# Patient Record
Sex: Male | Born: 1975
Health system: Southern US, Community
[De-identification: ages and names within clinical notes are randomized; demographics above are authoritative.]

## PROBLEM LIST (undated history)

## (undated) DIAGNOSIS — L719 Rosacea, unspecified: Secondary | ICD-10-CM

## (undated) DIAGNOSIS — F411 Generalized anxiety disorder: Secondary | ICD-10-CM

## (undated) DIAGNOSIS — I10 Essential (primary) hypertension: Secondary | ICD-10-CM

## (undated) DIAGNOSIS — F410 Panic disorder [episodic paroxysmal anxiety] without agoraphobia: Secondary | ICD-10-CM

## (undated) HISTORY — DX: Generalized anxiety disorder: F41.1

## (undated) HISTORY — DX: Panic disorder (episodic paroxysmal anxiety): F41.0

## (undated) HISTORY — PX: KNEE SURGERY: SHX244

## (undated) HISTORY — PX: APPENDECTOMY: SHX54

## (undated) HISTORY — DX: Essential (primary) hypertension: I10

## (undated) HISTORY — DX: Rosacea, unspecified: L71.9

---

## 2012-06-14 ENCOUNTER — Emergency Department
Admission: EM | Admit: 2012-06-14 | Discharge: 2012-06-14 | Disposition: A | Discharge status: Home / Self Care | Payer: No Typology Code available for payment source | Source: Home / Self Care | Attending: Family Medicine | Admitting: Family Medicine

## 2012-06-14 ENCOUNTER — Encounter: Payer: Self-pay | Admitting: *Deleted

## 2012-06-14 DIAGNOSIS — R509 Fever, unspecified: Secondary | ICD-10-CM

## 2012-06-14 DIAGNOSIS — R030 Elevated blood-pressure reading, without diagnosis of hypertension: Secondary | ICD-10-CM

## 2012-06-14 DIAGNOSIS — B349 Viral infection, unspecified: Secondary | ICD-10-CM

## 2012-06-14 DIAGNOSIS — J029 Acute pharyngitis, unspecified: Secondary | ICD-10-CM

## 2012-06-14 DIAGNOSIS — R6889 Other general symptoms and signs: Secondary | ICD-10-CM

## 2012-06-14 LAB — POCT INFLUENZA A/B: Influenza A, POC: NEGATIVE

## 2012-06-14 MED ORDER — OSELTAMIVIR PHOSPHATE 75 MG PO CAPS: 75.0000 mg | ORAL_CAPSULE | Freq: Two times a day (BID) | ORAL | Status: DC

## 2012-06-14 NOTE — ED Provider Notes (Signed)
History     CSN: [id number]  Arrival date & time 06/14/12  1939   First MD Initiated Contact with Patient 06/14/12 1947      Chief Complaint  Patient presents with  . Fever  . Generalized Body Aches  . Sore Throat   HPI  URI Symptoms Onset: 1 day  Description: generalized malaise, fever, chills, body aches, rhinorrhea  Modifying factors:  Has not had flu shot   Symptoms Nasal discharge: yes Fever: yes Sore throat: yes Cough: mild Wheezing: no Ear pain: no GI symptoms: no Sick contacts: yes  Red Flags  Stiff neck: no Dyspnea: no Rash: no Swallowing difficulty: no  Sinusitis Risk Factors Headache/face pain: no Double sickening: no tooth pain: no  Allergy Risk Factors Sneezing: no Itchy scratchy throat: no Seasonal symptoms: no  Flu Risk Factors Headache: no muscle aches: no severe fatigue: no   History reviewed. No pertinent past medical history.  Past Surgical History  Procedure Date  . Appendectomy     History reviewed. No pertinent family history.  History  Substance Use Topics  . Smoking status: Former Games developer  . Smokeless tobacco: Not on file  . Alcohol Use: No      Review of Systems  All other systems reviewed and are negative.    Allergies  Review of patient's allergies indicates no known allergies.  Home Medications   Current Outpatient Rx  Name  Route  Sig  Dispense  Refill  . OSELTAMIVIR PHOSPHATE 75 MG PO CAPS   Oral   Take 1 capsule (75 mg total) by mouth 2 (two) times daily.   10 capsule   0     BP 169/117  Pulse 103  Temp 98.8 F (37.1 C) (Oral)  Resp 16  Ht 5\' 8"  (1.727 m)  Wt 169 lb 4 oz (76.771 kg)  BMI 25.73 kg/m2  SpO2 98%  Physical Exam  Constitutional: He appears well-developed and well-nourished.  HENT:  Head: Normocephalic and atraumatic.  Right Ear: External ear normal.  Left Ear: External ear normal.       +nasal erythema, rhinorrhea bilaterally, + post oropharyngeal erythema    Eyes:  Conjunctivae normal are normal. Pupils are equal, round, and reactive to light.  Neck: Normal range of motion. Neck supple.  Cardiovascular: Normal rate, regular rhythm and normal heart sounds.   Pulmonary/Chest: Effort normal and breath sounds normal.  Abdominal: Soft.  Musculoskeletal: Normal range of motion.  Neurological: He is alert.  Skin: Skin is warm.    ED Course  Procedures (including critical care time)   Labs Reviewed  POCT INFLUENZA A/B  POCT RAPID STREP A (OFFICE)   No results found.   1. Flu-like symptoms   2. Viral illness   3. Elevated blood pressure reading without diagnosis of hypertension       MDM  Rapid flu negative though sxs consistent with flu. Will place on tamiflu for coverage.  Discussed infectious and resp red flags at length including SOB and weakness.  Noted elevated BP in setting of fever, high dose NSAID and nasal decongestant use. No CP. Follow up BP 171/93. Discussed avoidance of these medications. CV red flags discussed.  Otherwise follow up as needed.     The patient and/or caregiver has been counseled thoroughly with regard to treatment plan and/or medications prescribed including dosage, schedule, interactions, rationale for use, and possible side effects and they verbalize understanding. Diagnoses and expected course of recovery discussed and will return if not improved as  expected or if the condition worsens. Patient and/or caregiver verbalized understanding.             Doree Albee, MD 06/14/12 2027

## 2012-06-14 NOTE — ED Notes (Signed)
Pt c/o fever, sore throat, body ache since this morning. Has tried OTC Ibuprofen and Sudafed. Did not get flu shot this year.

## 2012-12-20 ENCOUNTER — Emergency Department
Admission: EM | Admit: 2012-12-20 | Discharge: 2012-12-20 | Disposition: A | Discharge status: Home / Self Care | Payer: No Typology Code available for payment source | Source: Home / Self Care | Attending: Emergency Medicine | Admitting: Emergency Medicine

## 2012-12-20 ENCOUNTER — Encounter: Payer: Self-pay | Admitting: Emergency Medicine

## 2012-12-20 ENCOUNTER — Emergency Department (INDEPENDENT_AMBULATORY_CARE_PROVIDER_SITE_OTHER): Payer: No Typology Code available for payment source

## 2012-12-20 DIAGNOSIS — M7989 Other specified soft tissue disorders: Secondary | ICD-10-CM

## 2012-12-20 DIAGNOSIS — M79609 Pain in unspecified limb: Secondary | ICD-10-CM

## 2012-12-20 DIAGNOSIS — M79644 Pain in right finger(s): Secondary | ICD-10-CM

## 2012-12-20 DIAGNOSIS — S6990XA Unspecified injury of unspecified wrist, hand and finger(s), initial encounter: Secondary | ICD-10-CM

## 2012-12-20 NOTE — ED Provider Notes (Signed)
CSN: [id number]     Arrival date & time 12/20/12  0846 History     First MD Initiated Contact with Patient 12/20/12 [phone]     Chief Complaint  Patient presents with  . 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  He slammed a car door onto his right middle finger last night.  He has used some ice and ibuprofen with little bit.  He does have a remote history of an injury in this finger while playing football in high school however up until now has been fully functioning.  Pain is constant moderate and a throbbing soreness.   History reviewed. No pertinent past medical history. Past Surgical History  Procedure Laterality Date  . Appendectomy     Family History  Problem Relation Age of Onset  . Hypertension Father    History  Substance Use Topics  . Smoking status: Current Every Day Smoker  . Smokeless tobacco: Not on file  . Alcohol Use: No    Review of Systems  All other systems reviewed and are negative.    Allergies  Rocephin  Home Medications   Current Outpatient Rx  Name  Route  Sig  Dispense  Refill  . oseltamivir (TAMIFLU) 75 MG capsule   Oral   Take 1 capsule (75 mg total) by mouth 2 (two) times daily.   10 capsule   0    BP 159/84  Pulse 80  Temp(Src) 98.2 F (36.8 C) (Oral)  Resp 16  Ht 5\' 8"  (1.727 m)  Wt 165 lb (74.844 kg)  BMI 25.09 kg/m2  SpO2 100% Physical Exam  Nursing note and vitals reviewed. Constitutional: He is oriented to person, place, and time. He appears well-developed and well-nourished.  HENT:  Head: Normocephalic and atraumatic.  Eyes: No scleral icterus.  Neck: Neck supple.  Cardiovascular: Regular rhythm and normal heart sounds.   Pulmonary/Chest: Effort normal and breath sounds normal. No respiratory distress.  Musculoskeletal:   Right middle finger examination demonstrates normal functioning with flexion-extension of the DIP, PIP, and MCP.  Distal neurovascular status is intact.  He has a small abrasion over the PIP and tenderness to palpation throughout the joint and a little bit proximal to that as well.  There is localized swelling. No other hand or wrist tenderness.  Neurological: He is alert and oriented to person, place, and time.  Skin: Skin is warm and dry.  Psychiatric: He has a normal mood and affect. His speech is normal.    ED Course   Procedures (including critical care time)  Labs Reviewed - No data to display Dg Finger Middle Right  12/20/2012   *RADIOLOGY REPORT*  Clinical Data: Slammed middle finger in car door 1 day ago with laceration over proximal interphalangeal joint  RIGHT MIDDLE FINGER 2+V  Comparison: None.  Findings: No definite fracture dislocation.  Along the volar surface of the base of the middle phalanx there is a tiny fleck of bone which could represent a chip fracture or a tiny sesamoid.  No donor site is appreciated to suggest that it represents a fracture. There is however soft tissue swelling at the proximal interphalangeal joint.  There is no radiodense foreign body.  IMPRESSION: Soft tissue swelling proximal interphalangeal joint with no definite fracture.  Tiny fleck of bone seen could represent an extremely subtle chip fracture or sesamoid.   Original  Report Authenticated By: Esperanza Heir, M.D.   1. Finger pain, right     MDM   And x-ray was obtained and read by the radiologist as above.  Possible small avulsion on the volar side, but most likely a finger sprain/contusion with some mild osteoarthritic changes.  Encourage rest, ice, compression with ACE bandage and/or a brace, and elevation of injured body part.  The role of anti-inflammatories is discussed with the patient.  We placed him in a finger splint and gave him a note for work.  Followup in sports medicine if not  improving in the next 2 weeks.  Marlaine Hind, MD 12/20/12 [phone]

## 2012-12-20 NOTE — ED Notes (Signed)
Reports getting right #3/middle finger slammed in car door last evening. Took ibuprofen this a.m. and has used ice.

## 2013-04-27 ENCOUNTER — Ambulatory Visit: Payer: No Typology Code available for payment source | Admitting: Physical Therapy

## 2013-04-29 ENCOUNTER — Ambulatory Visit: Payer: Self-pay | Admitting: Physical Therapy

## 2013-08-08 ENCOUNTER — Ambulatory Visit (INDEPENDENT_AMBULATORY_CARE_PROVIDER_SITE_OTHER): Payer: BC Managed Care – PPO | Admitting: Sports Medicine

## 2013-08-08 ENCOUNTER — Ambulatory Visit (INDEPENDENT_AMBULATORY_CARE_PROVIDER_SITE_OTHER): Payer: BC Managed Care – PPO

## 2013-08-08 ENCOUNTER — Encounter: Payer: Self-pay | Admitting: Sports Medicine

## 2013-08-08 ENCOUNTER — Telehealth: Payer: Self-pay | Admitting: *Deleted

## 2013-08-08 VITALS — BP 151/76 | HR 88 | Ht 68.0 in | Wt 165.0 lb

## 2013-08-08 DIAGNOSIS — M25569 Pain in unspecified knee: Secondary | ICD-10-CM

## 2013-08-08 DIAGNOSIS — M25562 Pain in left knee: Secondary | ICD-10-CM | POA: Insufficient documentation

## 2013-08-08 NOTE — Telephone Encounter (Signed)
PA obtained for MRI Knee LT w/o contrast. Auth # [id number]. Exp. 09/06/13.  Meyer CoryMisty Kree Armato, LPN

## 2013-08-08 NOTE — Assessment & Plan Note (Addendum)
Ernest Cole does endorse a history of what sounds like patellar dislocation, he does tell me it was reduced but tells me that it was his meniscus. Symptoms today are predominantly patellofemoral with a positive apprehension sign, and tenderness under medial and lateral patellar facets. Treatment will center around aggressive physical therapy, intra-articular injection today, x-rays. As his symptoms have been present so long, and he does prefer that we also ordered his MRI, I think this is acceptable. Patellar stabilizing brace. Return to see me in one month.

## 2013-08-08 NOTE — Progress Notes (Signed)
   Subjective:    I'm seeing this patient as a consultation for:  [facility] Dr[facility] Glee ArvinMichael Xu  CC:  L knee pain  HPI: Patient is a pleasant 38 yo male who presents with L knee pain for one year. He describes two events last year where his knee locked up and had to be popped back into place. He has most pain in the medial joint line with walking up stairs or sitting for extended periods of time.  In the last week he has started to have tingling in the posterior knee. He has seen a physician and was told to start PT for a meniscal tear, but was unable to due to insurance. He has also had a steroid knee injection, which relieved the pain for one month.   Past medical history, Surgical history, Family history not pertinant except as noted below, Social history, Allergies, and medications have been entered into the medical record, reviewed, and no changes needed.   Review of Systems: No headache, visual changes, nausea, vomiting, diarrhea, constipation, dizziness, abdominal pain, skin rash, fevers, chills, night sweats, weight loss, swollen lymph nodes, body aches, joint swelling, muscle aches, chest pain, shortness of breath, mood changes, visual or auditory hallucinations.   Objective:   General: Well Developed, well nourished, and in no acute distress.  Neuro/Psych: Alert and oriented x3, extra-ocular muscles intact, able to move all 4 extremities, sensation grossly intact. Skin: Warm and dry, no rashes noted.  Respiratory: Not using accessory muscles, speaking in full sentences, trachea midline.  Cardiovascular: Pulses palpable, no extremity edema. Abdomen: Does not appear distended. L Knee: Swollen along medial aspect. Tenderness to palpation along menicus and along the medial and lateral patellar facets. Pain with knee extension and valgus stress.   Procedure: Real-time Ultrasound Guided Injection of left knee Device: GE Logiq E  Verbal informed consent obtained.  Time-out conducted.    Noted no overlying erythema, induration, or other signs of local infection.  Skin prepped in a sterile fashion.  Local anesthesia: Topical Ethyl chloride.  With sterile technique and under real time ultrasound guidance:  2 cc Kenalog 40, 4 cc lidocaine injected easily into the suprapatellar recess. Completed without difficulty  Pain immediately resolved suggesting accurate placement of the medication.  Advised to call if fevers/chills, erythema, induration, drainage, or persistent bleeding.  Images permanently stored and available for review in the ultrasound unit.  Impression: Technically successful ultrasound guided injection.  X-rays were reviewed and do show mild patellar and femoral trochlear spurring, otherwise unremarkable.  Impression and Recommendations:   This case required medical decision making of moderate complexity.

## 2013-08-10 ENCOUNTER — Telehealth: Payer: Self-pay

## 2013-08-10 NOTE — Telephone Encounter (Signed)
[facility] called and stated that patient cancelled his appt because he could not afford it. Estelle JuneRhonda Cunningham,CMA'

## 2013-08-11 NOTE — Telephone Encounter (Signed)
Noted  

## 2013-08-13 ENCOUNTER — Ambulatory Visit (HOSPITAL_BASED_OUTPATIENT_CLINIC_OR_DEPARTMENT_OTHER): Payer: BC Managed Care – PPO

## 2013-08-15 ENCOUNTER — Ambulatory Visit (INDEPENDENT_AMBULATORY_CARE_PROVIDER_SITE_OTHER): Payer: BC Managed Care – PPO

## 2013-08-15 DIAGNOSIS — R609 Edema, unspecified: Secondary | ICD-10-CM

## 2013-08-15 DIAGNOSIS — M6281 Muscle weakness (generalized): Secondary | ICD-10-CM

## 2013-08-15 DIAGNOSIS — M25569 Pain in unspecified knee: Secondary | ICD-10-CM

## 2013-08-18 ENCOUNTER — Encounter: Payer: BC Managed Care – PPO | Admitting: Physical Therapy

## 2013-08-22 ENCOUNTER — Encounter (INDEPENDENT_AMBULATORY_CARE_PROVIDER_SITE_OTHER): Payer: BC Managed Care – PPO | Admitting: Physical Therapy

## 2013-08-22 DIAGNOSIS — M25569 Pain in unspecified knee: Secondary | ICD-10-CM

## 2013-08-22 DIAGNOSIS — M6281 Muscle weakness (generalized): Secondary | ICD-10-CM

## 2013-08-22 DIAGNOSIS — R609 Edema, unspecified: Secondary | ICD-10-CM

## 2013-08-25 ENCOUNTER — Encounter (INDEPENDENT_AMBULATORY_CARE_PROVIDER_SITE_OTHER): Payer: BC Managed Care – PPO | Admitting: Physical Therapy

## 2013-08-25 DIAGNOSIS — M6281 Muscle weakness (generalized): Secondary | ICD-10-CM

## 2013-08-25 DIAGNOSIS — R609 Edema, unspecified: Secondary | ICD-10-CM

## 2013-08-25 DIAGNOSIS — M25569 Pain in unspecified knee: Secondary | ICD-10-CM

## 2013-08-29 ENCOUNTER — Encounter: Payer: BC Managed Care – PPO | Admitting: Physical Therapy

## 2013-09-01 ENCOUNTER — Encounter (INDEPENDENT_AMBULATORY_CARE_PROVIDER_SITE_OTHER): Payer: BC Managed Care – PPO

## 2013-09-01 DIAGNOSIS — M6281 Muscle weakness (generalized): Secondary | ICD-10-CM

## 2013-09-01 DIAGNOSIS — R609 Edema, unspecified: Secondary | ICD-10-CM

## 2013-09-01 DIAGNOSIS — M25569 Pain in unspecified knee: Secondary | ICD-10-CM

## 2014-10-25 ENCOUNTER — Emergency Department (INDEPENDENT_AMBULATORY_CARE_PROVIDER_SITE_OTHER)
Admission: EM | Admit: 2014-10-25 | Discharge: 2014-10-25 | Disposition: A | Discharge status: Home / Self Care | Payer: BLUE CROSS/BLUE SHIELD | Source: Home / Self Care | Attending: Emergency Medicine | Admitting: Emergency Medicine

## 2014-10-25 ENCOUNTER — Encounter: Payer: Self-pay | Admitting: *Deleted

## 2014-10-25 DIAGNOSIS — S39011A Strain of muscle, fascia and tendon of abdomen, initial encounter: Secondary | ICD-10-CM

## 2014-10-25 NOTE — ED Notes (Signed)
Pt reports helping friend move 5 days ago, left pain in LLQ of abdomen. Mild pain present ever since.

## 2014-10-25 NOTE — ED Provider Notes (Signed)
CSN: [id number]     Arrival date & time 10/25/14  [id number] History   First MD Initiated Contact with Patient 10/25/14 1001     Chief Complaint  Patient presents with  . Abdominal Pain   (Consider location/radiation/quality/duration/timing/severity/associated sxs/prior Treatment) HPI Patient reports that 5 days ago, he was helping a friend move and he did repetitive lifting of items and boxes and did repetitive lifting and twisting. Ever since then, has a mild left mid abdominal wall pain, worse with movement. No deformity of abdominal wall seen, no pooching out of abdominal wall that he's noticed, but he wanted to be checked for abdominal wall hernia. No nausea or vomiting or fever or chills. No GU symptoms. No testicular or groin pain. History reviewed. No pertinent past medical history. Past Surgical History  Procedure Laterality Date  . Appendectomy     Family History  Problem Relation Age of Onset  . Hypertension Father    History  Substance Use Topics  . Smoking status: Former Games developermoker  . Smokeless tobacco: Never Used  . Alcohol Use: No    Review of Systems Remainder of Review of Systems negative for acute change except as noted in the HPI.  Allergies  Rocephin  Home Medications   Prior to Admission medications   Not on File   BP 164/104 mmHg  Pulse 98  Temp(Src) 98.3 F (36.8 C) (Oral)  Resp 16  Wt 165 lb (74.844 kg)  SpO2 99% Physical Exam  Constitutional: He is oriented to person, place, and time. He appears well-developed and well-nourished. No distress.  HENT:  Head: Normocephalic and atraumatic.  Eyes: Conjunctivae and EOM are normal. Pupils are equal, round, and reactive to light. No scleral icterus.  Neck: Normal range of motion.  Cardiovascular: Normal rate.   Pulmonary/Chest: Effort normal.  Abdominal: Soft. Bowel sounds are normal. He exhibits no distension and no mass. There is no hepatosplenomegaly. There is tenderness (mild, left midabdominal wall  but no deep tenderness.). There is no rebound, no guarding and no CVA tenderness. Hernia confirmed negative in the ventral area.  Musculoskeletal: Normal range of motion.  Neurological: He is alert and oriented to person, place, and time.  Skin: Skin is warm.  Psychiatric: He has a normal mood and affect.  Nursing note and vitals reviewed.   ED Course  Procedures (including critical care time) Labs Review Labs Reviewed - No data to display  Imaging Review No results found.   MDM   1. Abdominal wall strain, initial encounter    Discussed findings with patient. Questions invited and answered. Advised heat, ibuprofen when necessary. I would expect symptoms to resolve within the next several days. Reassured, he'll follow up with PCP when necessary Follow-up with your primary care doctor in 5-7 days if not improving, or sooner if symptoms become worse. Precautions discussed. Red flags discussed. Questions invited and answered. Patient voiced understanding and agreement.     Lajean Manesavid Massey, MD 10/25/14 [phone]

## 2015-03-16 ENCOUNTER — Ambulatory Visit (INDEPENDENT_AMBULATORY_CARE_PROVIDER_SITE_OTHER): Payer: BLUE CROSS/BLUE SHIELD | Admitting: Family Medicine

## 2015-03-16 ENCOUNTER — Ambulatory Visit (INDEPENDENT_AMBULATORY_CARE_PROVIDER_SITE_OTHER): Payer: BLUE CROSS/BLUE SHIELD

## 2015-03-16 ENCOUNTER — Encounter: Payer: Self-pay | Admitting: Family Medicine

## 2015-03-16 VITALS — BP 170/107 | HR 71 | Wt 167.0 lb

## 2015-03-16 DIAGNOSIS — M2391 Unspecified internal derangement of right knee: Secondary | ICD-10-CM | POA: Diagnosis not present

## 2015-03-16 MED ORDER — TRAMADOL HCL 50 MG PO TABS: 50.0000 mg | ORAL_TABLET | Freq: Three times a day (TID) | ORAL | Status: DC | PRN

## 2015-03-16 NOTE — Assessment & Plan Note (Signed)
Concern for a locked meniscus or bucket handle tear. Patient is unable to walk. Plan for stat MRI hopefully tomorrow. Crutches and pain medicines prescribed. Return following MRI for further discussion.

## 2015-03-16 NOTE — Addendum Note (Signed)
Addended by: Rodolph BongOREY, Halima Fogal S on: 03/16/2015 02:48 PM   Modules accepted: Kipp BroodSmartSet

## 2015-03-16 NOTE — Patient Instructions (Addendum)
Thank you for coming in today. We will call about MRI.  Take tramadol for pain.  Return after MRI  Meniscus Tear A meniscus tear is a knee injury in which a piece of the meniscus is torn. The meniscus is a thick, rubbery, wedge-shaped cartilage in the knee. Two menisci are located in each knee. They sit between the upper bone (femur) and lower bone (tibia) that make up the knee joint. Each meniscus acts as a shock absorber for the knee. A torn meniscus is one of the most common types of knee injuries. This injury can range from mild to severe. Surgery may be needed for a severe tear. CAUSES This injury may be caused by any squatting, twisting, or pivoting movement. Sports-related injuries are the most common cause. These often occur from:  Running and stopping suddenly.  Changing direction.  Being tackled or knocked off your feet. As people get older, their meniscus gets thinner and weaker. In these people, tears can happen more easily, such as from climbing stairs.  RISK FACTORS This injury is more likely to happen to:  People who play contact sports.  Males.  People who are 5130-40 years of age. SYMPTOMS  Symptoms of this injury include:  Knee pain, especially at the side of the knee joint. You may feel pain when the injury occurs, or you may only hear a pop and feel pain later.  A feeling that your knee is clicking, catching, locking, or giving way.  Not being able to fully bend or extend your knee.  Bruising or swelling in your knee. DIAGNOSIS  This injury may be diagnosed based on your symptoms and a physical exam. The physical exam may include:  Moving your knee in different ways.  Feeling for tenderness.  Listening for a clicking sound.  Checking if your knee locks or catches. You may also have tests, such as:  X-rays.  MRI.  A procedure to look inside your knee with a narrow surgical telescope (arthroscopy). You may be referred to a knee specialist  (orthopedic surgeon). TREATMENT  Treatment for this injury depends on the severity of the tear. Treatment for a mild tear may include:  Rest.  Medicine to reduce pain and swelling. This is usually a nonsteroidal anti-inflammatory drug (NSAID).  A knee brace or an elastic sleeve or wrap.  Using crutches or a walker to keep weight off your knee and to help you walk.  Exercises to strengthen your knee (physical therapy). You may need surgery if you have a severe tear or if other treatments are not working.  HOME CARE INSTRUCTIONS Managing Pain and Swelling  Take over-the-counter and prescription medicines only as told by your health care provider.  If directed, apply ice to the injured area:  Put ice in a plastic bag.  Place a towel between your skin and the bag.  Leave the ice on for 20 minutes, 2-3 times per day.  Raise (elevate) the injured area above the level of your heart while you are sitting or lying down. Activity  Do not use the injured limb to support your body weight until your health care provider says that you can. Use crutches or a walker as told by your health care provider.  Return to your normal activities as told by your health care provider. Ask your health care provider what activities are safe for you.  Perform range-of-motion exercises only as told by your health care provider.  Begin doing exercises to strengthen your knee and leg  muscles only as told by your health care provider. After you recover, your health care provider may recommend these exercises to help prevent another injury. General Instructions  Use a knee brace or elastic wrap as told by your health care provider.  Keep all follow-up visits as told by your health care provider. This is important. SEEK MEDICAL CARE IF:  You have a fever.  Your knee becomes red, tender, or swollen.  Your pain medicine is not helping.  Your symptoms get worse or do not improve after 2 weeks of home  care.   This information is not intended to replace advice given to you by your health care provider. Make sure you discuss any questions you have with your health care provider.   Document Released: 08/02/2002 Document Revised: 01/31/2015 Document Reviewed: 09/04/2014 Elsevier Interactive Patient Education [employer].

## 2015-03-16 NOTE — Progress Notes (Signed)
Addendum: MRI report back from [facility] shows a large medial meniscus bucket handle tear flipped into the intercondylar notch. I have discussed the case with Dr. Thurston HoleWainer. [facility] at 2pm.

## 2015-03-16 NOTE — Progress Notes (Signed)
Quick Note:  Xray was normal. Awaiting MRI ______

## 2015-03-16 NOTE — Progress Notes (Signed)
   Subjective:    I'm seeing this patient as a consultation for:  Junius FinnerErin O'Malley PA-C  CC: Knee pain.  HPI: Patient was referred directly from triage of urgent care this morning for acute right knee pain. Patient was in his normal state of health when he squatted down and felt a pop. He notes significant knee pain and feels that he cannot straighten his leg fully. He feels as though it is becoming locked. He is unable to walk. He thinks something is dislocated or out of place. He notes that he's had similar problems with his left knee that is suspected to be due to a meniscus injury. He has never had these problems with his right knee. He is unable to walk or work because of this pain and the pain is severe. He has not tried any treatment yet.  Past medical history, Surgical history, Family history not pertinant except as noted below, Social history, Allergies, and medications have been entered into the medical record, reviewed, and no changes needed.   Review of Systems: No headache, visual changes, nausea, vomiting, diarrhea, constipation, dizziness, abdominal pain, skin rash, fevers, chills, night sweats, weight loss, swollen lymph nodes, body aches, joint swelling, muscle aches, chest pain, shortness of breath, mood changes, visual or auditory hallucinations.   Objective:    Filed Vitals:   03/16/15 1051  BP: 170/107  Pulse: 71   General: Well Developed, well nourished, and in no acute distress.  Neuro/Psych: Alert and oriented x3, extra-ocular muscles intact, able to move all 4 extremities, sensation grossly intact. Skin: Warm and dry, no rashes noted.  Respiratory: Not using accessory muscles, speaking in full sentences, trachea midline.  Cardiovascular: Pulses palpable, no extremity edema. Abdomen: Does not appear distended. MSK: Right knee well-appearing without significant effusion or deformity. Patella is in place with normal talar motion without patellar apprehension test. Very  tender to palpation medial joint line. Positive medial McMurray's test. Unable to fully extend or flex the knee. Stable valgus and varus stress and negative anterior drawer test.  No results found for this or any previous visit (from the past 24 hour(s)). Dg Knee Complete 4 Views Right  03/16/2015  CLINICAL DATA:  Locking right knee EXAM: RIGHT KNEE - COMPLETE 4+ VIEW COMPARISON:  08/08/2013 FINDINGS: There is no evidence of fracture, dislocation, or joint effusion. There is no evidence of arthropathy or other focal bone abnormality. Soft tissues are unremarkable. IMPRESSION: Negative. Electronically Signed   By: Marlan Palauharles  Clark M.D.   On: 03/16/2015 12:44    Impression and Recommendations:   This case required medical decision making of moderate complexity.

## 2015-03-27 ENCOUNTER — Ambulatory Visit (HOSPITAL_COMMUNITY)
Admission: RE | Admit: 2015-03-27 | Discharge: 2015-03-27 | Disposition: A | Discharge status: Home / Self Care | Payer: BLUE CROSS/BLUE SHIELD | Source: Ambulatory Visit | Attending: Internal Medicine | Admitting: Internal Medicine

## 2015-03-27 ENCOUNTER — Other Ambulatory Visit (HOSPITAL_COMMUNITY): Payer: Self-pay | Admitting: Orthopedic Surgery

## 2015-03-27 DIAGNOSIS — M7989 Other specified soft tissue disorders: Secondary | ICD-10-CM | POA: Diagnosis not present

## 2015-04-04 ENCOUNTER — Ambulatory Visit: Payer: BLUE CROSS/BLUE SHIELD | Admitting: Rehabilitative and Restorative Service Providers"

## 2015-11-14 DIAGNOSIS — R220 Localized swelling, mass and lump, head: Secondary | ICD-10-CM | POA: Diagnosis not present

## 2015-11-14 DIAGNOSIS — K029 Dental caries, unspecified: Secondary | ICD-10-CM | POA: Diagnosis not present

## 2015-11-14 DIAGNOSIS — K047 Periapical abscess without sinus: Secondary | ICD-10-CM | POA: Diagnosis not present

## 2016-02-03 ENCOUNTER — Emergency Department
Admission: EM | Admit: 2016-02-03 | Discharge: 2016-02-03 | Disposition: A | Discharge status: Home / Self Care | Payer: BLUE CROSS/BLUE SHIELD | Source: Home / Self Care | Attending: Family Medicine | Admitting: Family Medicine

## 2016-02-03 ENCOUNTER — Encounter: Payer: Self-pay | Admitting: Emergency Medicine

## 2016-02-03 DIAGNOSIS — M25532 Pain in left wrist: Secondary | ICD-10-CM | POA: Diagnosis not present

## 2016-02-03 DIAGNOSIS — S61412A Laceration without foreign body of left hand, initial encounter: Secondary | ICD-10-CM | POA: Diagnosis not present

## 2016-02-03 DIAGNOSIS — R030 Elevated blood-pressure reading, without diagnosis of hypertension: Secondary | ICD-10-CM

## 2016-02-03 DIAGNOSIS — IMO0001 Reserved for inherently not codable concepts without codable children: Secondary | ICD-10-CM

## 2016-02-03 DIAGNOSIS — S0990XA Unspecified injury of head, initial encounter: Secondary | ICD-10-CM

## 2016-02-03 MED ORDER — HYDROCODONE-ACETAMINOPHEN 5-325 MG PO TABS: 1.0000 | ORAL_TABLET | Freq: Four times a day (QID) | ORAL | 0 refills | Status: DC | PRN

## 2016-02-03 MED ORDER — CEPHALEXIN 500 MG PO CAPS: 500.0000 mg | ORAL_CAPSULE | Freq: Two times a day (BID) | ORAL | 0 refills | Status: DC

## 2016-02-03 MED ORDER — LIDOCAINE-EPINEPHRINE-TETRACAINE (LET) SOLUTION: 3.0000 mL | Freq: Once | NASAL | Status: DC

## 2016-02-03 NOTE — ED Provider Notes (Signed)
CSN: [id number]     Arrival date & time 02/03/16  1333 History   First MD Initiated Contact with Patient 02/03/16 1335     Chief Complaint  Patient presents with  . Laceration   (Consider location/radiation/quality/duration/timing/severity/associated sxs/prior Treatment) HPI  Ernest Cole is a 40 y.o. male presenting to UC with c/o laceration to his Left palm close to his wrist just PTA after hit by a garage door.  He states he works on [employer] and was trying to put a garage door back on track that had not been opened in years, but garage was too heavy for him to hold up.  He also reports getting hit in the back of the head by garage door but denies LOC.  Bleeding controlled PTA with direct pressure.  Pain is aching and sharp, moderate in severity.  He does not believe he was hit by garage hard enough to break any bones.  He did take ibuprofen PTA.  He is not on blood thinners. He is Right hand dominant.  Pt states he is more in shock of what happened than in pain.  Last tetanus was 1 year ago.   History reviewed. No pertinent past medical history. Past Surgical History:  Procedure Laterality Date  . APPENDECTOMY     Family History  Problem Relation Age of Onset  . Hypertension Father    Social History  Substance Use Topics  . Smoking status: Former Games developer  . Smokeless tobacco: Never Used  . Alcohol use No    Review of Systems  Musculoskeletal: Negative for arthralgias, joint swelling and myalgias.  Skin: Positive for wound. Negative for color change.    Allergies  Rocephin [ceftriaxone sodium in dextrose]  Home Medications   Prior to Admission medications   Medication Sig Start Date End Date Taking? Authorizing Provider  cephALEXin (KEFLEX) 500 MG capsule Take 1 capsule (500 mg total) by mouth 2 (two) times daily. 02/03/16   Junius Finner, PA-C  HYDROcodone-acetaminophen (NORCO/VICODIN) 5-325 MG tablet Take 1-2 tablets by mouth every 6 (six) hours as needed for moderate  pain or severe pain. 02/03/16   Junius Finner, PA-C   Meds Ordered and Administered this Visit   Medications  lidocaine-EPINEPHrine-tetracaine (LET) solution (not administered)    BP (!) 193/129 (BP Location: Left Arm)   Pulse 76   Temp 98.2 F (36.8 C) (Oral)   Ht 5\' 8"  (1.727 m)   Wt 165 lb (74.8 kg)   SpO2 99%   BMI 25.09 kg/m  No data found.   Physical Exam  Constitutional: He is oriented to person, place, and time. He appears well-developed and well-nourished.  HENT:  Head: Normocephalic. Head is with contusion.    Eyes: EOM are normal.  Neck: Normal range of motion.  Cardiovascular: Normal rate.   Pulmonary/Chest: Effort normal.  Musculoskeletal: Normal range of motion. He exhibits tenderness. He exhibits no edema or deformity.       Hands: Left hand and wrist: Full ROM, tenderness to palmar aspect towards wrist due to wound (see skin exam)  Neurological: He is alert and oriented to person, place, and time.  Skin: Skin is warm and dry. Capillary refill takes less than 2 seconds.  Left hand, palmar aspect, near wrist: 5cm laceration with jagged edges, adipose tissue exposed. Bleeding controlled. No tendons or muscle visible.  No foreign body seen or palpated.  Psychiatric: He has a normal mood and affect. His behavior is normal.  Nursing note and vitals reviewed.  Urgent Care Course   Clinical Course    .Marland KitchenLaceration Repair Date/Time: 02/03/2016 3:05 PM Performed by: Junius Finner Authorized by: Donna Christen A   Consent:    Consent obtained:  Verbal   Consent given by:  Patient   Risks discussed:  Infection, pain, nerve damage, poor cosmetic result, poor wound healing, need for additional repair, retained foreign body, tendon damage and vascular damage Anesthesia (see MAR for exact dosages):    Anesthesia method:  Topical application and local infiltration   Topical anesthetic:  LET   Local anesthetic:  Lidocaine 1% w/o epi Laceration details:     Location:  Hand   Hand location:  L palm   Length (cm):  5   Depth (mm):  2 Repair type:    Repair type:  Intermediate Pre-procedure details:    Preparation:  Patient was prepped and draped in usual sterile fashion Exploration:    Hemostasis achieved with:  Direct pressure   Wound exploration: wound explored through full range of motion and entire depth of wound probed and visualized     Wound extent: no fascia violation noted, no foreign bodies/material noted, no muscle damage noted, no nerve damage noted, no tendon damage noted and no vascular damage noted     Contaminated: no   Treatment:    Area cleansed with:  Saline   Amount of cleaning:  Extensive   Irrigation solution:  Sterile saline   Irrigation volume:  40cc   Irrigation method:  Syringe   Visualized foreign bodies/material removed: no   Skin repair:    Repair method:  Sutures   Suture size:  4-0   Suture material:  Prolene   Suture technique:  Simple interrupted   Number of sutures:  9 Approximation:    Approximation:  Close   Vermilion border: well-aligned   Post-procedure details:    Dressing:  Antibiotic ointment and non-adherent dressing (wrist splint applied to prevent pulling of sutures with wrist flexion and extension)   Patient tolerance of procedure:  Tolerated well, no immediate complications   (including critical care time)  Labs Review Labs Reviewed - No data to display  Imaging Review No results found.   MDM   1. Hand laceration, left, initial encounter   2. Elevated blood pressure   3. Scalp injury, initial encounter    Laceration to palm aspect of Left hand. PMS in tact. No foreign bodies seen or palpated. Discussed imaging with pt, pt does not believe he was hit hard enough by garage to cause fracture.   Scalp: mild erythema and tenderness, minimal edema. Skin in tact. No crepitus or deformity. Normal neuro exam.  Elevated BP upon arrival, likely due to anxiety and shock of injury.  Pt  placed in trendelenburg position, given cool damp washcloths. Pt alert and cooperative during exam.  Wound closed w/o immediate complication. BP improved to 196/113.  Pt notes his BP is always high at the doctor office but normal at home. He also notes he took Sudafed this morning for nasal congestion. Encouraged to establish care with PCP for blood pressure monitoring.  Rx: norco and keflex due to nature of injury (rxn to Rocephin sounds more due to fear and pain of injection itself rather than medication, discussed signs/symptoms of allergic reaction. Advised to stop taking medication and call UC for different medication if needed)  Home care instructions provided. F/u in 2 day for wound recheck.  F/u in 10 days for suture removal. Patient and wife verbalized understanding  and agreement with treatment plan.     Junius Finnerrin O'Malley, PA-C 02/03/16 [phone]

## 2016-02-03 NOTE — ED Triage Notes (Signed)
Pt was working on a garage door when the glass fell onto his left wrist and cut his wrist.   Bleeding, pain, able to move his wrist..   TD x 1 year ago.

## 2016-02-03 NOTE — Discharge Instructions (Signed)
°  Please keep bandage in place for 24-48 hours.  After removing bandage, gently clean with soap and water.  You may apply over the counter antibiotic ointment and light bandage to keep clean and protected.  Clean wound at least twice a day and apply new bandage as it may continue to bleed slightly due to area of wound.  You may use wrist splint to help prevent you from pulling out sutures from moving wrist too quickly on accident. Do NOT soak your hand in any water such as a bathtub, hot tub, pool or sink as it could increase risk of infection.  Please take oral antibiotics, Keflex, as prescribed to help prevent infection.  If you develop swelling, pain, redness, drainage or pus or fever, please be re-seen to ensure proper healing of wound.    Norco/Vicodin (hydrocodone-acetaminophen) is a narcotic pain medication, do not combine these medications with others containing tylenol. While taking, do not drink alcohol, drive, or perform any other activities that requires focus while taking these medications.

## 2016-02-11 ENCOUNTER — Ambulatory Visit (INDEPENDENT_AMBULATORY_CARE_PROVIDER_SITE_OTHER): Payer: BLUE CROSS/BLUE SHIELD | Admitting: Family Medicine

## 2016-02-11 ENCOUNTER — Other Ambulatory Visit: Payer: Self-pay | Admitting: Family Medicine

## 2016-02-11 ENCOUNTER — Encounter: Payer: Self-pay | Admitting: Family Medicine

## 2016-02-11 VITALS — BP 174/130 | HR 88 | Ht 68.0 in | Wt 162.0 lb

## 2016-02-11 DIAGNOSIS — I10 Essential (primary) hypertension: Secondary | ICD-10-CM | POA: Diagnosis not present

## 2016-02-11 DIAGNOSIS — F419 Anxiety disorder, unspecified: Secondary | ICD-10-CM | POA: Diagnosis not present

## 2016-02-11 MED ORDER — LISINOPRIL-HYDROCHLOROTHIAZIDE 10-12.5 MG PO TABS: 1.0000 | ORAL_TABLET | Freq: Every day | ORAL | 1 refills | Status: DC

## 2016-02-11 NOTE — Progress Notes (Signed)
Ernest Cole is a 40 y.o. male who [name] today for establish care and discuss hypertension. Patient denies any chest pains palpitations shortness of breath. His blood pressure was found to be elevated at an urgent care visit previously. He suffered a laceration to his left wrist and his blood pressure was systolic 180s. In the interim he's been checking his blood pressure at home and notes the average systolic blood pressures around 160. Previously he has been diagnosed with hypertension and was treated with Lopressor which cause significant fatigue.   Additionally patient had laceration repair of his left wrist about a week ago. He would like sutures removed possible. He denies any pain swelling or fever  Lastly he notes anxiety. He is always had anxiety but does not think it causes significant problems in his life. He notes feeling nervous having trouble relaxing or controlling worrying.   Past Medical History:  Diagnosis Date  . Hypertension    Past Surgical History:  Procedure Laterality Date  . APPENDECTOMY    . KNEE SURGERY     Social History  Substance Use Topics  . Smoking status: Current Every Day Smoker    Packs/day: 0.50    Types: Cigarettes  . Smokeless tobacco: Never Used  . Alcohol use No   family history includes Cancer in his maternal grandmother; Hypertension in his father and mother.  ROS as above: No headache, visual changes, nausea, vomiting, diarrhea, constipation, dizziness, abdominal pain, skin rash, fevers, chills, night sweats, weight loss, swollen lymph nodes, body aches, joint swelling, muscle aches, chest pain, shortness of breath, , visual or auditory hallucinations.   Medications: Current Outpatient Prescriptions  Medication Sig Dispense Refill  . lisinopril-hydrochlorothiazide (PRINZIDE,ZESTORETIC) 10-12.5 MG  tablet Take 1 tablet by mouth daily. 30 tablet 1   No current facility-administered medications for this visit.    Allergies  Allergen Reactions  . Ceftriaxone Other (See Comments)    Passed out     Exam:  BP (!) 174/130 (BP Location: Left Arm, Patient Position: Sitting, Cuff Size: Small)   Pulse 88   Ht 5\' 8"  (1.727 m)   Wt 162 lb (73.5 kg)   BMI 24.63 kg/m  Gen: Well NAD HEENT: EOMI,  MMM Lungs: Normal work of breathing. CTABL Heart: RRR no MRG Abd: NABS, Soft. Nondistended, Nontender Exts: Brisk capillary refill, warm and well perfused.  Left wrist skin: Crescent-shaped well-appearing laceration with intact sutures. No wound erythema or induration. Sutures removed. No wound dehiscence. Psych: Alert and oriented normal speech thought process and affect.  GAD 7 : Generalized Anxiety Score 02/11/2016  Nervous, Anxious, on Edge 3  Control/stop worrying 2  Worry too much - different things 3  Trouble relaxing 3  Restless 3  Easily annoyed or irritable 2  Afraid - awful might happen 2  Total GAD 7 Score 18  Anxiety Difficulty Somewhat difficult    Depression screen PHQ 2/9 02/11/2016  Decreased Interest 0  Down, Depressed, Hopeless 0  PHQ - 2 Score 0  Altered sleeping 2  Tired, decreased energy 2  Change in appetite 0  Feeling bad or failure about yourself  0  Trouble concentrating 2  Moving slowly or fidgety/restless 2  Suicidal thoughts 0  PHQ-9 Score 8  Difficult doing work/chores Somewhat difficult     No results found for this or any previous visit (from the past 24 hour(s)). No results found.  Assessment and Plan: 40 y.o. male with  Hypertension: Not well controlled. Start lisinopril/hydrochlorothiazide. Check basic labs. Return in one month.  Anxiety: Obviously significantly significantly elevated GAD 7. The patient does not perceive his symptoms to be especially problematic. He is more interested in a more natural approach. I brought up the idea of  cognitive behavioral therapy which she is thinking about.  Orders Placed This Encounter  Procedures  . CBC  . Comprehensive metabolic panel    Order Specific Question:   Has the patient fasted?    Answer:   No  . Lipid panel    Order Specific Question:   Has the patient fasted?    Answer:   No  . VITAMIN D 25 Hydroxy (Vit-D Deficiency, Fractures)  . TSH  . Hemoglobin A1c    Discussed warning signs or symptoms. Please see discharge instructions. Patient expresses understanding.

## 2016-02-11 NOTE — Patient Instructions (Signed)
Thank you for coming in today. Start blood pressure medicine.  Get fasting labs.  Return in 1 month for recheck. \ Call or go to the emergency room if you get worse, have trouble breathing, have chest pains, or palpitations.    Hydrochlorothiazide, HCTZ; Lisinopril tablets What is this medicine? HYDROCHLOROTHIAZIDE; LISINOPRIL (hye droe klor oh THYE a zide; lyse IN oh pril) is a combination of a diuretic and an ACE inhibitor. It is used to treat high blood pressure. This medicine may be used for other purposes; ask your health care provider or pharmacist if you have questions. What should I tell my health care provider before I take this medicine? They need to know if you have any of these conditions: -bone marrow disease -decreased urine -heart or blood vessel disease -if you are on a special diet like a low salt diet -immune system problems, like lupus -kidney disease -liver disease -previous swelling of the tongue, face, or lips with difficulty breathing, difficulty swallowing, hoarseness, or tightening of the throat -recent heart attack or stroke -an unusual or allergic reaction to lisinopril, hydrochlorothiazide, sulfa drugs, other medicines, insect venom, foods, dyes, or preservatives -pregnant or trying to get pregnant -breast-feeding How should I use this medicine? Take this medicine by mouth with a glass of water. Follow the directions on the prescription label. You can take it with or without food. If it upsets your stomach, take it with food. Take your medicine at regular intervals. Do not take it more often than directed. Do not stop taking except on your doctor's advice. Talk to your pediatrician regarding the use of this medicine in children. Special care may be needed. Overdosage: If you think you have taken too much of this medicine contact a poison control center or emergency room at once. NOTE: This medicine is only for you. Do not share this medicine with others. What  if I miss a dose? If you miss a dose, take it as soon as you can. If it is almost time for your next dose, take only that dose. Do not take double or extra doses. What may interact with this medicine? -barbiturates like phenobarbital -blood pressure medicines -corticosteroids like prednisone -diabetic medications -diuretics, especially triamterene, spironolactone or amiloride -lithium -NSAIDs like ibuprofen -potassium salts or potassium supplements -prescription pain medicines -skeletal muscle relaxants like tubocurarine -some cholesterol lowering medications like cholestyramine or colestipol This list may not describe all possible interactions. Give your health care provider a list of all the medicines, herbs, non-prescription drugs, or dietary supplements you use. Also tell them if you smoke, drink alcohol, or use illegal drugs. Some items may interact with your medicine. What should I watch for while using this medicine? Visit your doctor or health care professional for regular checks on your progress. Check your blood pressure as directed. Ask your doctor or health care professional what your blood pressure should be and when you should contact him or her. Call your doctor or health care professional if you notice an irregular or fast heart beat. You must not get dehydrated. Ask your doctor or health care professional how much fluid you need to drink a day. Check with him or her if you get an attack of severe diarrhea, nausea and vomiting, or if you sweat a lot. The loss of too much body fluid can make it dangerous for you to take this medicine. Women should inform their doctor if they wish to become pregnant or think they might be pregnant. There is a  potential for serious side effects to an unborn child. Talk to your health care professional or pharmacist for more information. You may get drowsy or dizzy. Do not drive, use machinery, or do anything that needs mental alertness until you know  how this drug affects you. Do not stand or sit up quickly, especially if you are an older patient. This reduces the risk of dizzy or fainting spells. Alcohol can make you more drowsy and dizzy. Avoid alcoholic drinks. This medicine may affect your blood sugar level. If you have diabetes, check with your doctor or health care professional before changing the dose of your diabetic medicine. Avoid salt substitutes unless you are told otherwise by your doctor or health care professional. This medicine can make you more sensitive to the sun. Keep out of the sun. If you cannot avoid being in the sun, wear protective clothing and use sunscreen. Do not use sun lamps or tanning beds/booths. Do not treat yourself for coughs, colds, or pain while you are taking this medicine without asking your doctor or health care professional for advice. Some ingredients may increase your blood pressure. What side effects may I notice from receiving this medicine? Side effects that you should report to your doctor or health care professional as soon as possible: -changes in vision -confusion, dizziness, light headedness or fainting spells -decreased amount of urine passed -difficulty breathing or swallowing, hoarseness, or tightening of the throat -eye pain -fast or irregular heart beat, palpitations, or chest pain -muscle cramps -nausea and vomiting -persistent dry cough -redness, blistering, peeling or loosening of the skin, including inside the mouth -stomach pain -swelling of your face, lips, tongue, hands, or feet -unusual rash, bleeding or bruising, or pinpoint red spots on the skin -worsened gout pain -yellowing of the eyes or skin Side effects that usually do not require medical attention (report to your doctor or health care professional if they continue or are bothersome): -change in sex drive or performance -cough -headache This list may not describe all possible side effects. Call your doctor for  medical advice about side effects. You may report side effects to FDA at 1-800-FDA-1088. Where should I keep my medicine? Keep out of the reach of children. Store at room temperature between 20 and 25 degrees C (68 and 77 degrees F). Protect from moisture and excessive light. Keep container tightly closed. Throw away any unused medicine after the expiration date. NOTE: This sheet is a summary. It may not cover all possible information. If you have questions about this medicine, talk to your doctor, pharmacist, or health care provider.    2016, [employer]vier/Gold Standard. (2010-01-30 13:33:52)

## 2016-02-13 ENCOUNTER — Ambulatory Visit: Payer: BLUE CROSS/BLUE SHIELD | Admitting: Family Medicine

## 2016-02-18 DIAGNOSIS — I10 Essential (primary) hypertension: Secondary | ICD-10-CM | POA: Diagnosis not present

## 2016-02-18 LAB — CBC
HEMATOCRIT: 48.1 % (ref 38.5–50.0)
HEMOGLOBIN: 17.2 g/dL — AB (ref 13.2–17.1)
MCH: 33.4 pg — ABNORMAL HIGH (ref 27.0–33.0)
MCHC: 35.8 g/dL (ref 32.0–36.0)
MCV: 93.4 fL (ref 80.0–100.0)
MPV: 8.7 fL (ref 7.5–12.5)
Platelets: 288 10*3/uL (ref 140–400)
RBC: 5.15 MIL/uL (ref 4.20–5.80)
RDW: 13 % (ref 11.0–15.0)
WBC: 9.2 10*3/uL (ref 3.8–10.8)

## 2016-02-19 LAB — LIPID PANEL
CHOL/HDL RATIO: 3.7 ratio (ref <=5.0)
CHOLESTEROL: 179 mg/dL (ref 125–200)
HDL: 49 mg/dL (ref >=40)
LDL Cholesterol: 104 mg/dL (ref <130)
Triglycerides: 130 mg/dL (ref <150)
VLDL: 26 mg/dL (ref <30)

## 2016-02-19 LAB — COMPREHENSIVE METABOLIC PANEL
ALBUMIN: 4.5 g/dL (ref 3.6–5.1)
ALK PHOS: 93 U/L (ref 40–115)
ALT: 27 U/L (ref 9–46)
AST: 24 U/L (ref 10–40)
BUN: 11 mg/dL (ref 7–25)
CALCIUM: 9.8 mg/dL (ref 8.6–10.3)
CO2: 28 mmol/L (ref 20–31)
Chloride: 102 mmol/L (ref 98–110)
Creat: 1.01 mg/dL (ref 0.60–1.35)
Glucose, Bld: 100 mg/dL — ABNORMAL HIGH (ref 65–99)
POTASSIUM: 4.6 mmol/L (ref 3.5–5.3)
Sodium: 139 mmol/L (ref 135–146)
Total Bilirubin: 0.7 mg/dL (ref 0.2–1.2)
Total Protein: 7 g/dL (ref 6.1–8.1)

## 2016-02-19 LAB — HEMOGLOBIN A1C
HEMOGLOBIN A1C: 5.3 % (ref <5.7)
MEAN PLASMA GLUCOSE: 105 mg/dL

## 2016-02-19 LAB — VITAMIN D 25 HYDROXY (VIT D DEFICIENCY, FRACTURES): Vit D, 25-Hydroxy: 34 ng/mL (ref 30–100)

## 2016-02-19 LAB — TSH: TSH: 0.86 m[IU]/L (ref 0.40–4.50)

## 2016-03-12 ENCOUNTER — Ambulatory Visit (INDEPENDENT_AMBULATORY_CARE_PROVIDER_SITE_OTHER): Payer: BLUE CROSS/BLUE SHIELD | Admitting: Family Medicine

## 2016-03-12 ENCOUNTER — Encounter: Payer: Self-pay | Admitting: Family Medicine

## 2016-03-12 VITALS — BP 144/93 | HR 79 | Wt 164.0 lb

## 2016-03-12 DIAGNOSIS — F17200 Nicotine dependence, unspecified, uncomplicated: Secondary | ICD-10-CM | POA: Diagnosis not present

## 2016-03-12 DIAGNOSIS — I10 Essential (primary) hypertension: Secondary | ICD-10-CM | POA: Diagnosis not present

## 2016-03-12 MED ORDER — LISINOPRIL-HYDROCHLOROTHIAZIDE 20-25 MG PO TABS: 1.0000 | ORAL_TABLET | Freq: Every day | ORAL | 1 refills | Status: DC

## 2016-03-12 NOTE — Patient Instructions (Signed)
Thank you for coming in today. Increase blood pressure medicine.  Recheck in 1 month.

## 2016-03-12 NOTE — Progress Notes (Signed)
       Ernest Cole is a 40 y.o. male who [name] today for follow up HTN.   Patient has done well in the last month with lisinopril hydrochlorothiazide 10/12.5. As well as no chest pains palpitations or shortness of breath.   Past Medical History:  Diagnosis Date  . Hypertension    Past Surgical History:  Procedure Laterality Date  . APPENDECTOMY    . KNEE SURGERY     Social History  Substance Use Topics  . Smoking status: Current Every Day Smoker    Packs/day: 0.50    Types: Cigarettes  . Smokeless tobacco: Never Used  . Alcohol use No   family history includes Cancer in his maternal grandmother; Hypertension in his father and mother.  ROS as above:  Medications: Current Outpatient Prescriptions  Medication Sig Dispense Refill  . lisinopril-hydrochlorothiazide (PRINZIDE,ZESTORETIC) 20-25 MG tablet Take 1 tablet by mouth daily. 30 tablet 1   No current facility-administered medications for this visit.    Allergies  Allergen Reactions  . Ceftriaxone Other (See Comments)    Passed out    Health Maintenance Health Maintenance  Topic Date Due  . HIV Screening  11/14/1989  . TETANUS/TDAP  11/14/1993  . INFLUENZA VACCINE  12/25/2015     Exam:  BP (!) 144/93   Pulse 79   Wt 164 lb (74.4 kg)   BMI 24.94 kg/m  Gen: Well NAD HEENT: EOMI,  MMM Lungs: Normal work of breathing. CTABL Heart: RRR no MRG Abd: NABS, Soft. Nondistended, Nontender Exts: Brisk capillary refill, warm and well perfused.    No results found for this or any previous visit (from the past 72 hour(s)). No results found.    Assessment and Plan: 40 y.o. male with Hypertension improved. Increase lisinopril/hydrochlorothiazide to 20/25. Recheck in one month.  Work on smoking cessation as well.   No orders of the defined types were placed in this  encounter.   Discussed warning signs or symptoms. Please see discharge instructions. Patient expresses understanding.

## 2016-04-09 ENCOUNTER — Ambulatory Visit: Payer: BLUE CROSS/BLUE SHIELD | Admitting: Family Medicine

## 2016-04-15 ENCOUNTER — Encounter: Payer: Self-pay | Admitting: Family Medicine

## 2016-04-15 ENCOUNTER — Ambulatory Visit (INDEPENDENT_AMBULATORY_CARE_PROVIDER_SITE_OTHER): Payer: BLUE CROSS/BLUE SHIELD | Admitting: Family Medicine

## 2016-04-15 VITALS — BP 123/83 | HR 86 | Wt 161.0 lb

## 2016-04-15 DIAGNOSIS — I10 Essential (primary) hypertension: Secondary | ICD-10-CM

## 2016-04-15 MED ORDER — LISINOPRIL-HYDROCHLOROTHIAZIDE 20-25 MG PO TABS: 1.0000 | ORAL_TABLET | Freq: Every day | ORAL | 3 refills | Status: DC

## 2016-04-15 NOTE — Patient Instructions (Signed)
Thank you for coming in today. Continue the medicine Get labs Monday Return in 1 year or sooner if needed.  Work on quitting smoking.

## 2016-04-15 NOTE — Progress Notes (Signed)
       Ernest Cole is a 40 y.o. male who presents [facility]: [facility] today for follow-up hypertension. Patient takes the below medication and does well with no chest pains palpitations shortness of breath lightheadedness or dizziness. He notes his home blood pressures are typically in the 120s range.   Past Medical History:  Diagnosis Date  . Hypertension    Past Surgical History:  Procedure Laterality Date  . APPENDECTOMY    . KNEE SURGERY     Social History  Substance Use Topics  . Smoking status: Current Every Day Smoker    Packs/day: 0.50    Types: Cigarettes  . Smokeless tobacco: Never Used  . Alcohol use No   family history includes Cancer in his maternal grandmother; Hypertension in his father and mother.  ROS as above:  Medications: Current Outpatient Prescriptions  Medication Sig Dispense Refill  . lisinopril-hydrochlorothiazide (PRINZIDE,ZESTORETIC) 20-25 MG tablet Take 1 tablet by mouth daily. 90 tablet 3   No current facility-administered medications for this visit.    Allergies  Allergen Reactions  . Ceftriaxone Other (See Comments)    Passed out    Health Maintenance Health Maintenance  Topic Date Due  . HIV Screening  11/14/1989  . TETANUS/TDAP  11/14/1993  . INFLUENZA VACCINE  12/25/2015     Exam:  BP 123/83   Pulse 86   Wt 161 lb (73 kg)   BMI 24.48 kg/m  Gen: Well NAD HEENT: EOMI,  MMM Lungs: Normal work of breathing. CTABL Heart: RRR no MRG Abd: NABS, Soft. Nondistended, Nontender Exts: Brisk capillary refill, warm and well perfused.    No results found for this or any previous visit (from the past 72 hour(s)). No results found.    Assessment and Plan: 40 y.o. male with Well-controlled hypertension. Continue current regimen check basic metabolic panel. Recheck in 1 year.  Work on smoking cessation.   Orders  Placed This Encounter  Procedures  . BASIC METABOLIC PANEL WITH GFR    Discussed warning signs or symptoms. Please see discharge instructions. Patient expresses understanding.

## 2016-04-22 DIAGNOSIS — I10 Essential (primary) hypertension: Secondary | ICD-10-CM | POA: Diagnosis not present

## 2016-04-23 LAB — BASIC METABOLIC PANEL WITH GFR
BUN: 9 mg/dL (ref 7–25)
CALCIUM: 9.7 mg/dL (ref 8.6–10.3)
CO2: 25 mmol/L (ref 20–31)
CREATININE: 0.87 mg/dL (ref 0.60–1.35)
Chloride: 98 mmol/L (ref 98–110)
GFR, Est African American: >89 mL/min (ref >=60)
GFR, Est Non African American: >89 mL/min (ref >=60)
GLUCOSE: 135 mg/dL — AB (ref 65–99)
Potassium: 3.9 mmol/L (ref 3.5–5.3)
Sodium: 133 mmol/L — ABNORMAL LOW (ref 135–146)

## 2016-07-03 ENCOUNTER — Ambulatory Visit (INDEPENDENT_AMBULATORY_CARE_PROVIDER_SITE_OTHER): Payer: BLUE CROSS/BLUE SHIELD | Admitting: Family Medicine

## 2016-07-03 ENCOUNTER — Encounter: Payer: Self-pay | Admitting: Family Medicine

## 2016-07-03 VITALS — BP 141/82 | HR 87 | Temp 98.0°F | Wt 165.0 lb

## 2016-07-03 DIAGNOSIS — L719 Rosacea, unspecified: Secondary | ICD-10-CM

## 2016-07-03 DIAGNOSIS — H9201 Otalgia, right ear: Secondary | ICD-10-CM | POA: Diagnosis not present

## 2016-07-03 HISTORY — DX: Rosacea, unspecified: L71.9

## 2016-07-03 MED ORDER — NEOMYCIN-POLYMYXIN-HC 1 % OT SOLN: 3.0000 [drp] | Freq: Four times a day (QID) | OTIC | 0 refills | Status: DC

## 2016-07-03 MED ORDER — AZITHROMYCIN 250 MG PO TABS: 250.0000 mg | ORAL_TABLET | Freq: Every day | ORAL | 0 refills | Status: DC

## 2016-07-03 MED ORDER — METRONIDAZOLE 1 % EX GEL: Freq: Every day | CUTANEOUS | 1 refills | Status: DC

## 2016-07-03 MED ORDER — FLUTICASONE PROPIONATE 50 MCG/ACT NA SUSP: 2.0000 | Freq: Every day | NASAL | 2 refills | Status: DC

## 2016-07-03 NOTE — Patient Instructions (Signed)
Thank you for coming in today. Use zyrtec and flonase.  Use the ear drops.  Avoid gum Use antibiotic backup if not better.  Return if not better.    Otitis Externa Otitis externa is a germ infection in the outer ear. The outer ear is the area from the eardrum to the outside of the ear. Otitis externa is sometimes called "swimmer's ear." HOME CARE  Put drops in the ear as told by your doctor.  Only take medicine as told by your doctor.  If you have diabetes, your doctor may give you more directions. Follow your doctor's directions.  Keep all doctor visits as told. To avoid another infection:  Keep your ear dry. Use the corner of a towel to dry your ear after swimming or bathing.  Avoid scratching or putting things inside your ear.  Avoid swimming in lakes, dirty water, or pools that use a chemical called chlorine poorly.  You may use ear drops after swimming. Combine equal amounts of white vinegar and alcohol in a bottle. Put 3 or 4 drops in each ear. GET HELP IF:   You have a fever.  Your ear is still red, puffy (swollen), or painful after 3 days.  You still have yellowish-white fluid (pus) coming from the ear after 3 days.  Your redness, puffiness, or pain gets worse.  You have a really bad headache.  You have redness, puffiness, pain, or tenderness behind your ear. MAKE SURE YOU:   Understand these instructions.  Will watch your condition.  Will get help right away if you are not doing well or get worse. This information is not intended to replace advice given to you by your health care provider. Make sure you discuss any questions you have with your health care provider. Document Released: 10/29/2007 Document Revised: 06/02/2014 Document Reviewed: 02/19/2015 Elsevier Interactive Patient Education  2017 [employer].    [employer] Barotitis media is inflammation of your middle ear. This occurs when the auditory tube (eustachian tube) leading from the  back of your nose (nasopharynx) to your eardrum is blocked. This blockage may result from a cold, environmental allergies, or an upper respiratory infection. Unresolved barotitis media may lead to damage or hearing loss (barotrauma), which may become permanent. HOME CARE INSTRUCTIONS   Use medicines as recommended by your health care provider. Over-the-counter medicines will help unblock the canal and can help during times of air travel.  Do not put anything into your ears to clean or unplug them. Eardrops will not be helpful.  Do not swim, dive, or fly until your health care provider says it is all right to do so. If these activities are necessary, chewing gum with frequent, forceful swallowing may help. It is also helpful to hold your nose and gently blow to pop your ears for equalizing pressure changes. This forces air into the eustachian tube.  Only take over-the-counter or prescription medicines for pain, discomfort, or fever as directed by your health care provider.  A decongestant may be helpful in decongesting the middle ear and make pressure equalization easier. SEEK MEDICAL CARE IF:  You experience a serious form of dizziness in which you feel as if the room is spinning and you feel nauseated (vertigo).  Your symptoms only involve one ear. SEEK IMMEDIATE MEDICAL CARE IF:   You develop a severe headache, dizziness, or severe ear pain.  You have bloody or pus-like drainage from your ears.  You develop a fever.  Your problems do not improve or  become worse. MAKE SURE YOU:   Understand these instructions.  Will watch your condition.  Will get help right away if you are not doing well or get worse. This information is not intended to replace advice given to you by your health care provider. Make sure you discuss any questions you have with your health care provider. Document Released: 05/09/2000 Document Revised: 03/02/2013 Document Reviewed: 12/07/2012 Elsevier Interactive  Patient Education  2017 Elsevier Inc.   Temporomandibular Joint Syndrome Temporomandibular joint (TMJ) syndrome is a condition that affects the joints between your jaw and your skull. The TMJs are located near your ears and allow your jaw to open and close. These joints and the nearby muscles are involved in all movements of the jaw. People with TMJ syndrome have pain in the area of these joints and muscles. Chewing, biting, or other movements of the jaw can be difficult or painful. TMJ syndrome can be caused by various things. In many cases, the condition is mild and goes away within a few weeks. For some people, the condition can become a long-term problem. What are the causes? Possible causes of TMJ syndrome include:  Grinding your teeth or clenching your jaw. Some people do this when they are under stress.  Arthritis.  Injury to the jaw.  Head or neck injury.  Teeth or dentures that are not aligned well. In some cases, the cause of TMJ syndrome may not be known. What are the signs or symptoms? The most common symptom is an aching pain on the side of the head in the area of the TMJ. Other symptoms may include:  Pain when moving your jaw, such as when chewing or biting.  Being unable to open your jaw all the way.  Making a clicking sound when you open your mouth.  Headache.  Earache.  Neck or shoulder pain. How is this diagnosed? Diagnosis can usually be made based on your symptoms, your medical history, and a physical exam. Your health care provider may check the range of motion of your jaw. Imaging tests, such as X-rays or an MRI, are sometimes done. You may need to see your dentist to determine if your teeth and jaw are lined up correctly. How is this treated? TMJ syndrome often goes away on its own. If treatment is needed, the options may include:  Eating soft foods and applying ice or heat.  Medicines to relieve pain or inflammation.  Medicines to relax the  muscles.  A splint, bite plate, or mouthpiece to prevent teeth grinding or jaw clenching.  Relaxation techniques or counseling to help reduce stress.  Transcutaneous electrical nerve stimulation (TENS). This helps to relieve pain by applying an electrical current through the skin.  Acupuncture. This is sometimes helpful to relieve pain.  Jaw surgery. This is rarely needed. Follow these instructions at home:  Take medicines only as directed by your health care provider.  Eat a soft diet if you are having trouble chewing.  Apply ice to the painful area.  Put ice in a plastic bag.  Place a towel between your skin and the bag.  Leave the ice on for 20 minutes, 2-3 times a day.  Apply a warm compress to the painful area as directed.  Massage your jaw area and perform any jaw stretching exercises as recommended by your health care provider.  If you were given a mouthpiece or bite plate, wear it as directed.  Avoid foods that require a lot of chewing. Do not chew gum.  Keep all follow-up visits as directed by your health care provider. This is important. Contact a health care provider if:  You are having trouble eating.  You have new or worsening symptoms. Get help right away if:  Your jaw locks open or closed. This information is not intended to replace advice given to you by your health care provider. Make sure you discuss any questions you have with your health care provider. Document Released: 02/04/2001 Document Revised: 01/10/2016 Document Reviewed: 12/15/2013 Elsevier Interactive Patient Education  2017 [employer]

## 2016-07-03 NOTE — Progress Notes (Signed)
Ernest Cole is a 41 y.o. male who presents to [facility]: Primary Care Sports Medicine today for ear pain.  He has had ear pain and fullness that has been stable over the past month. The pain ranges from mild to moderate and worsens with activity. No fevers over the past month, except for a 100.0 temperature yesterday along with a sore throat that have since resolved. He has also had some allergies and nasal congestion over the past 2 weeks and has not taken any medications. He has taken ibuprofen and used sweet oil ear drops with some pain relief. Yesterday, he noted jaw pain that worsens with chewing. He has never had ear infections before.    He also has facial erythema that worsens with cold weather.  This has been ongoing for years. He notes that his father has rosacea.   Past Medical History:  Diagnosis Date  . Hypertension    Past Surgical History:  Procedure Laterality Date  . APPENDECTOMY    . KNEE SURGERY     Social History  Substance Use Topics  . Smoking status: Current Every Day Smoker    Packs/day: 0.50    Types: Cigarettes  . Smokeless tobacco: Never Used  . Alcohol use No   family history includes Cancer in his maternal grandmother; Hypertension in his father and mother.  ROS as above:  Medications: Current Outpatient Prescriptions  Medication Sig Dispense Refill  . lisinopril-hydrochlorothiazide (PRINZIDE,ZESTORETIC) 20-25 MG tablet Take 1 tablet by mouth daily. 90 tablet 3   No current facility-administered medications for this visit.    Allergies  Allergen Reactions  . Ceftriaxone Other (See Comments)    Passed out    Health Maintenance Health Maintenance  Topic Date Due  . HIV Screening  11/14/1989  . TETANUS/TDAP  11/14/1993  . INFLUENZA VACCINE  12/25/2015     Exam:  BP (!) 141/82 (BP Location: Left Arm, Cuff Size: Normal)   Pulse 87   Temp 98  F (36.7 C) (Oral)   Wt 165 lb (74.8 kg)   SpO2 98%   BMI 25.09 kg/m  Gen: Well NAD HEENT: EOMI,  MMM, right TM milky and retracted with mild surrounding erythema, left TM retracted with fluid and bubbles. Right TMJ nontender to palpation but painful with movement. No otalgia on manipulation of right ear canal. Oropharynx clear, no cervical lymphadenopathy. Lungs: Normal work of breathing. CTABL Heart: RRR no MRG Abd: NABS, Soft. Nondistended, Nontender Exts: Brisk capillary refill, warm and well perfused.  Skin: Facial telangiectasias noted in malar distribution   No results found for this or any previous visit (from the past 72 hour(s)). No results found.  Assessment and Plan: 41 y.o. male with  Right otalgia: Bilateral retracted TMs noted. Likely eustachian tube dysfunction given recent allergies and increased congestion with possible right otitis externa or AOM.  - Zyrtec and flonase for nasal congestion / allergies - Corticosporin ear drops - Back up azithyromycin  Rosacea: - Metronidazole gel  No orders of the defined types were placed in this encounter.  Meds ordered this encounter  Medications  . NEOMYCIN-POLYMYXIN-HYDROCORTISONE (CORTISPORIN) 1 % SOLN otic solution    Sig: Place 3 drops into the right ear 4 (four) times daily.    Dispense:  10 mL    Refill:  0  . fluticasone (FLONASE) 50 MCG/ACT nasal spray    Sig: Place 2 sprays into both nostrils daily.    Dispense:  16  g    Refill:  2  . azithromycin (ZITHROMAX) 250 MG tablet    Sig: Take 1 tablet (250 mg total) by mouth daily. Take first 2 tablets together, then 1 every day until finished.    Dispense:  6 tablet    Refill:  0  . metroNIDAZOLE (METROGEL) 1 % gel    Sig: Apply topically daily.    Dispense:  45 g    Refill:  1     Discussed warning signs or symptoms. Please see discharge instructions. Patient expresses understanding.

## 2016-07-03 NOTE — Progress Notes (Signed)
Pt has had a sore throat for several days.  Right ear has started hurting that shoots pain into the temporal area.  Pain level 3 now, but says that it goes up to a 6-7 during the day.  Stated that temp was low 100 yesterday.

## 2017-01-25 IMAGING — CR DG KNEE COMPLETE 4+V*R*
4 series · 4 of 4 positions shown · non-contrast
Comparison: 08/08/2013

CLINICAL DATA: Locking right knee

EXAM:
RIGHT KNEE - COMPLETE 4+ VIEW

[knee ap]
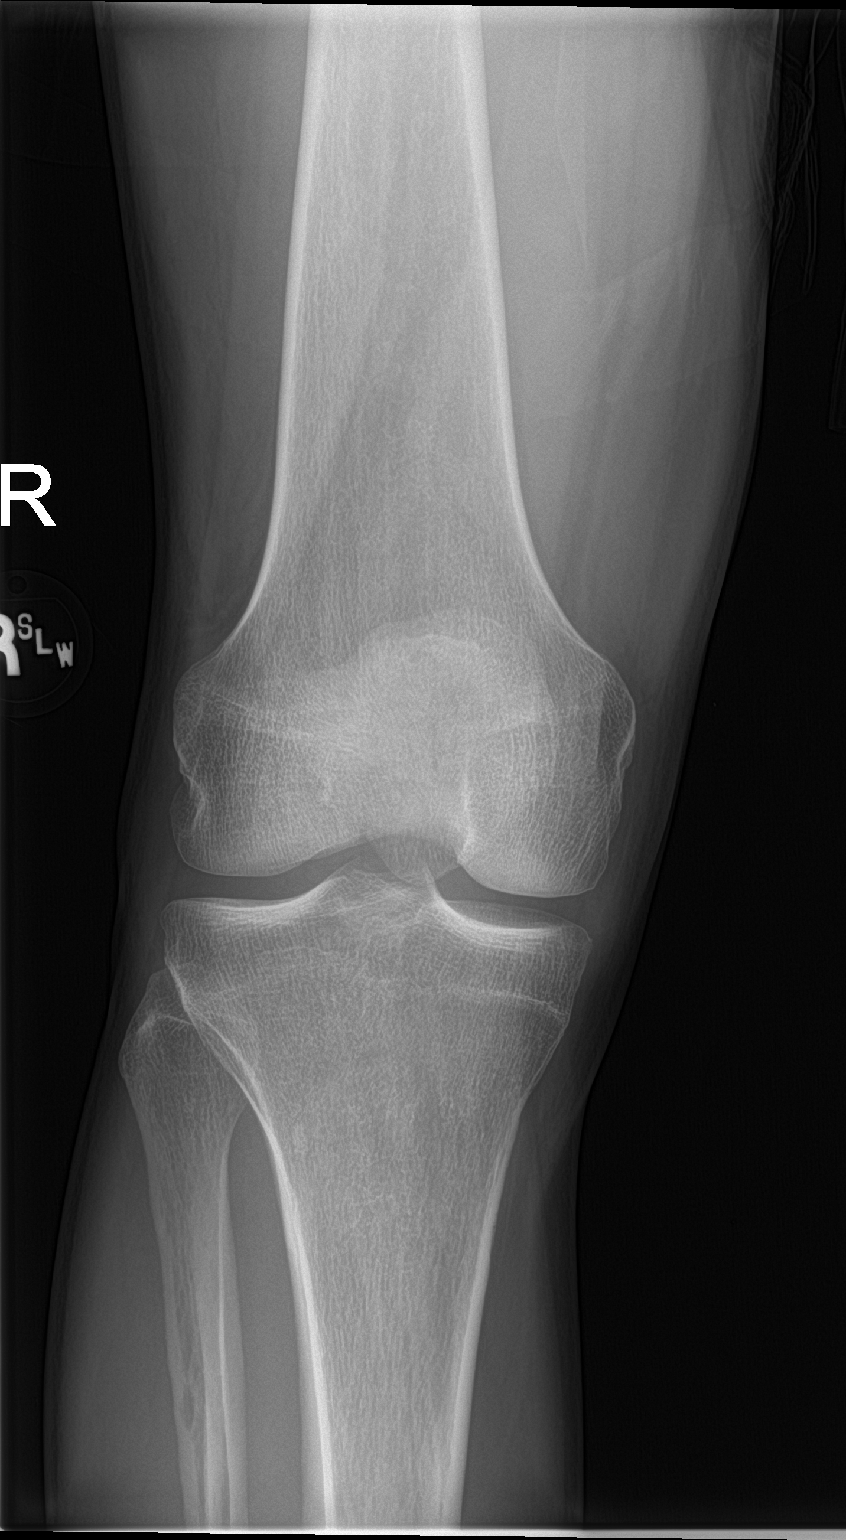

[knee lat]
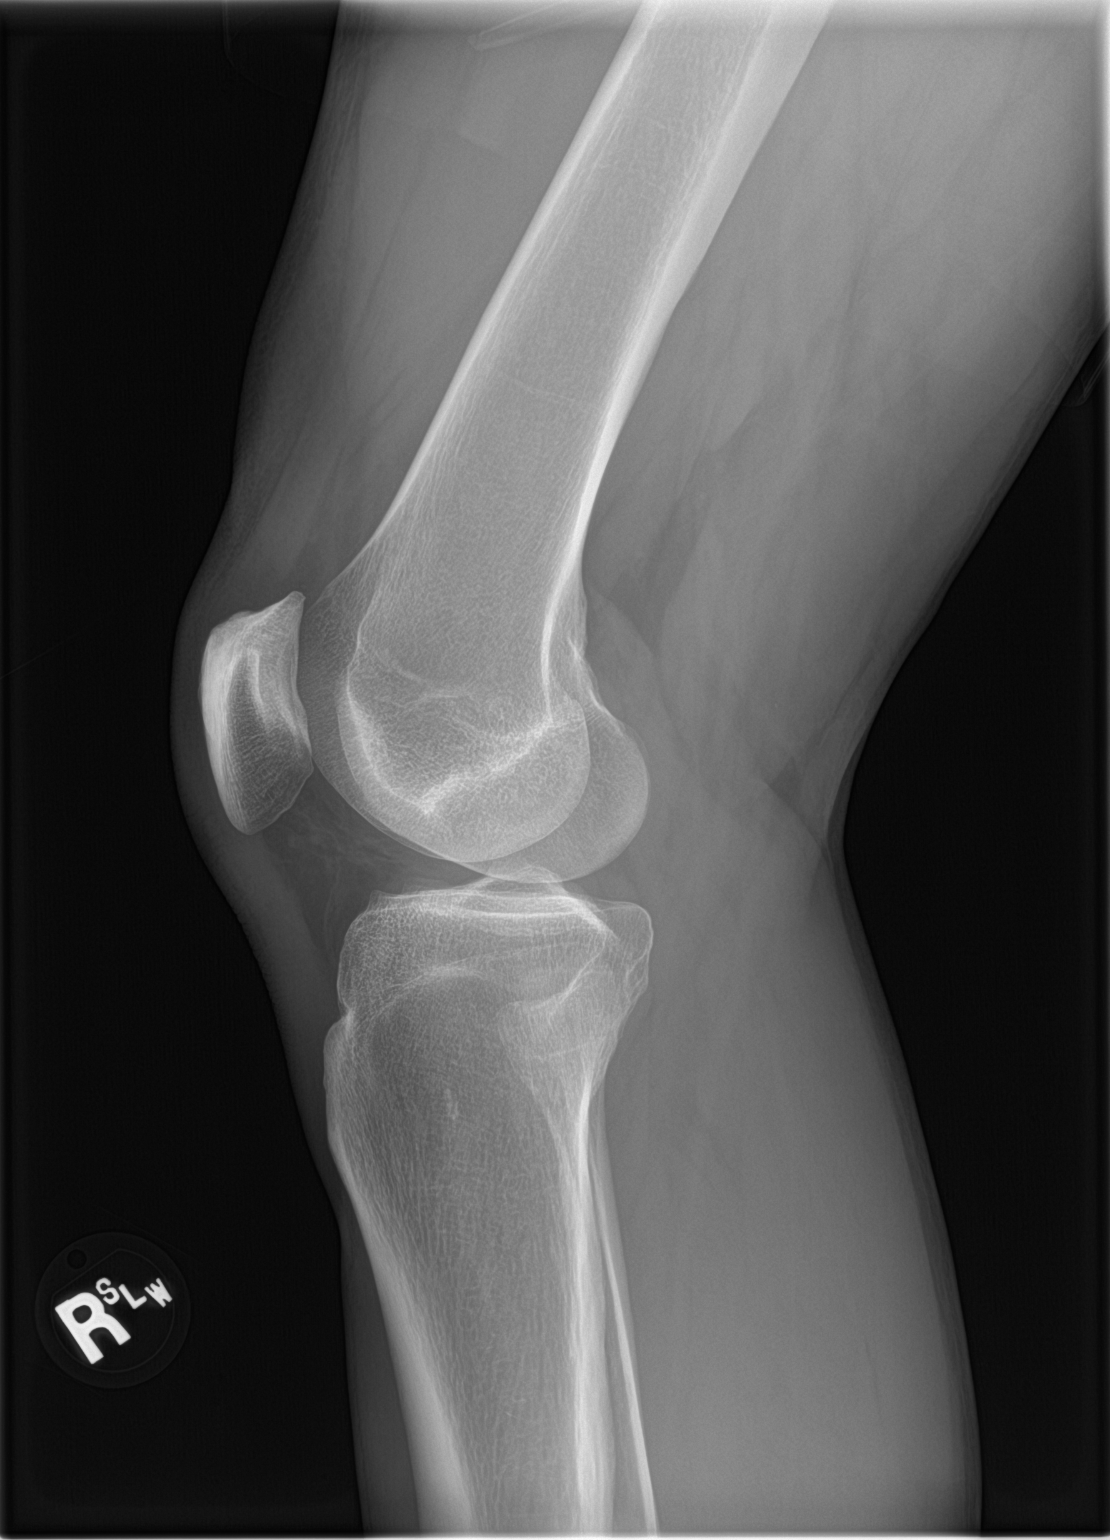

[knee obl (1 of 2)]
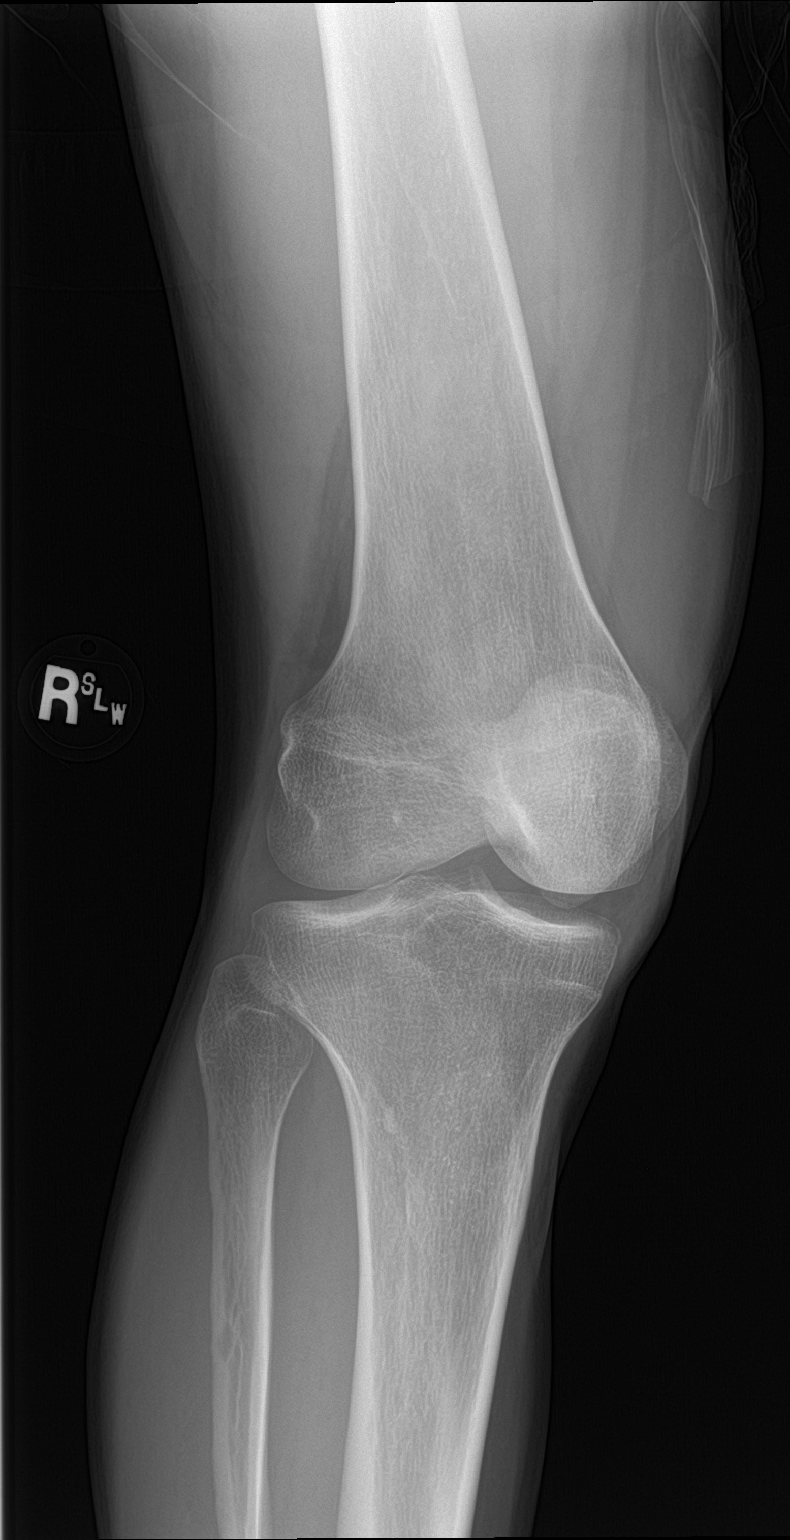

[knee obl (2 of 2)]
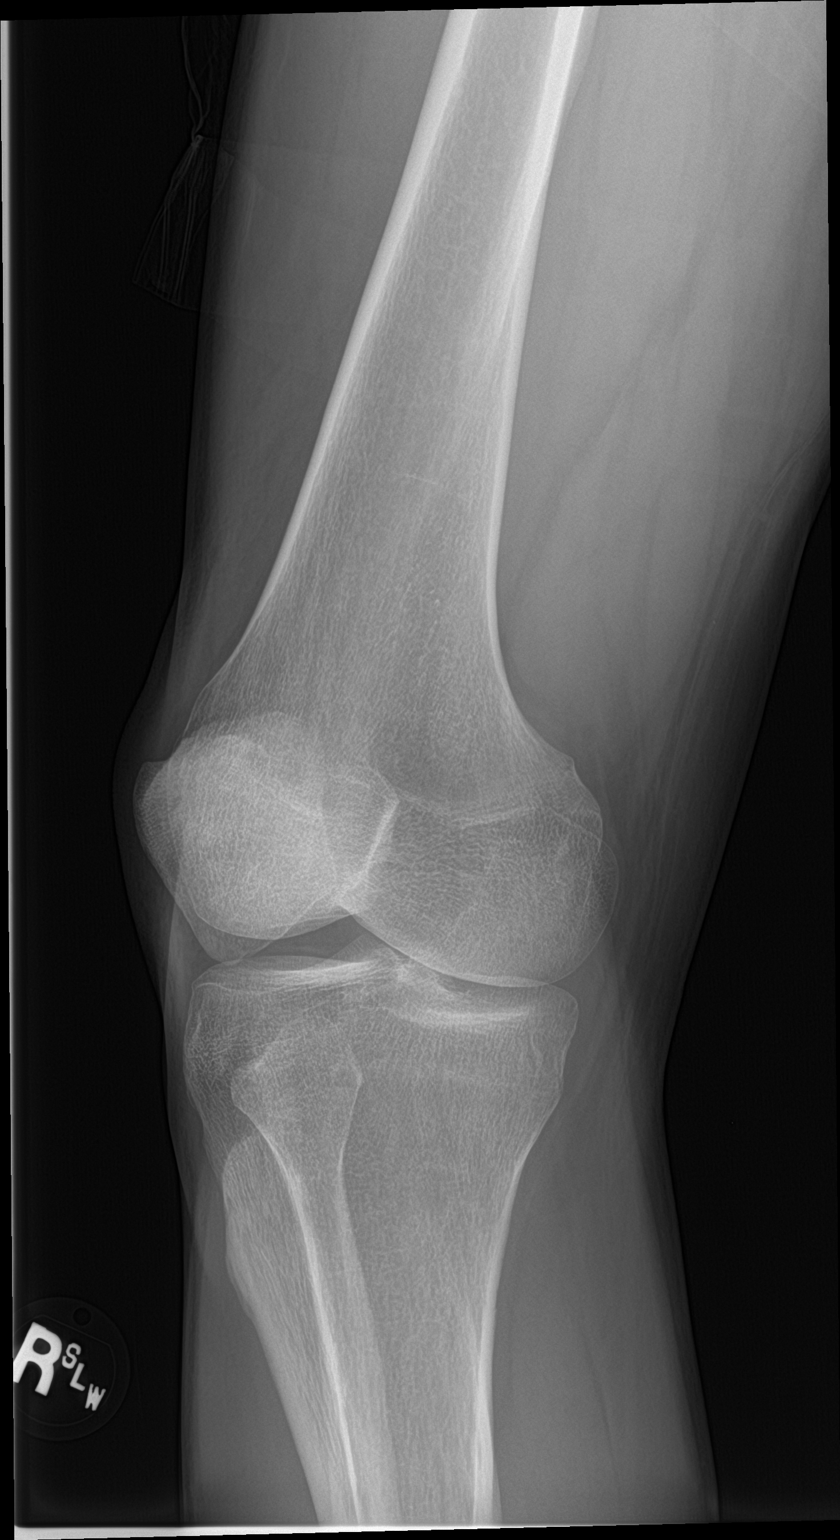

[4 of 4 positions shown; findings below may reference images not displayed]

FINDINGS: There is no evidence of fracture, dislocation, or joint effusion.
There is no evidence of arthropathy or other focal bone abnormality.
Soft tissues are unremarkable.
IMPRESSION: Negative.

## 2017-04-15 ENCOUNTER — Ambulatory Visit: Payer: BLUE CROSS/BLUE SHIELD | Admitting: Family Medicine

## 2017-05-04 ENCOUNTER — Other Ambulatory Visit: Payer: Self-pay | Admitting: Family Medicine

## 2017-05-04 DIAGNOSIS — I10 Essential (primary) hypertension: Secondary | ICD-10-CM

## 2017-06-03 ENCOUNTER — Other Ambulatory Visit: Payer: Self-pay | Admitting: Family Medicine

## 2017-06-03 DIAGNOSIS — I10 Essential (primary) hypertension: Secondary | ICD-10-CM

## 2017-06-05 ENCOUNTER — Encounter: Payer: Self-pay | Admitting: Family Medicine

## 2017-06-05 ENCOUNTER — Ambulatory Visit: Payer: BLUE CROSS/BLUE SHIELD | Admitting: Family Medicine

## 2017-06-05 VITALS — BP 138/89 | HR 83 | Ht 68.0 in | Wt 167.0 lb

## 2017-06-05 DIAGNOSIS — R000 Tachycardia, unspecified: Secondary | ICD-10-CM

## 2017-06-05 DIAGNOSIS — Z0000 Encounter for general adult medical examination without abnormal findings: Secondary | ICD-10-CM

## 2017-06-05 DIAGNOSIS — Z23 Encounter for immunization: Secondary | ICD-10-CM

## 2017-06-05 DIAGNOSIS — I10 Essential (primary) hypertension: Secondary | ICD-10-CM | POA: Diagnosis not present

## 2017-06-05 DIAGNOSIS — F17200 Nicotine dependence, unspecified, uncomplicated: Secondary | ICD-10-CM

## 2017-06-05 DIAGNOSIS — IMO0001 Reserved for inherently not codable concepts without codable children: Secondary | ICD-10-CM

## 2017-06-05 LAB — T3, FREE: T3 FREE: 3.3 pg/mL (ref 2.3–4.2)

## 2017-06-05 LAB — COMPLETE METABOLIC PANEL WITH GFR
AG Ratio: 1.9 (calc) (ref 1.0–2.5)
ALT: 27 U/L (ref 9–46)
AST: 20 U/L (ref 10–40)
Albumin: 4.3 g/dL (ref 3.6–5.1)
Alkaline phosphatase (APISO): 87 U/L (ref 40–115)
BILIRUBIN TOTAL: 0.5 mg/dL (ref 0.2–1.2)
BUN: 8 mg/dL (ref 7–25)
CALCIUM: 9.9 mg/dL (ref 8.6–10.3)
CHLORIDE: 99 mmol/L (ref 98–110)
CO2: 28 mmol/L (ref 20–32)
Creat: 0.84 mg/dL (ref 0.60–1.35)
GFR, EST NON AFRICAN AMERICAN: 108 mL/min/{1.73_m2} (ref >=60)
GFR, Est African American: 125 mL/min/{1.73_m2} (ref >=60)
Globulin: 2.3 g/dL (calc) (ref 1.9–3.7)
Glucose, Bld: 184 mg/dL — ABNORMAL HIGH (ref 65–99)
Potassium: 4 mmol/L (ref 3.5–5.3)
Sodium: 135 mmol/L (ref 135–146)
Total Protein: 6.6 g/dL (ref 6.1–8.1)

## 2017-06-05 LAB — T4, FREE: FREE T4: 1.3 ng/dL (ref 0.8–1.8)

## 2017-06-05 LAB — TSH: TSH: 0.48 m[IU]/L (ref 0.40–4.50)

## 2017-06-05 MED ORDER — LISINOPRIL-HYDROCHLOROTHIAZIDE 20-25 MG PO TABS: 1.0000 | ORAL_TABLET | Freq: Every day | ORAL | 3 refills | Status: DC

## 2017-06-05 NOTE — Patient Instructions (Signed)
Thank you for coming in today. Get labs today.  We will do Tdap vaccine.  Recheck yearly.  Let me know if you help quitting smoking.

## 2017-06-05 NOTE — Progress Notes (Signed)
       Ernest Cole is a 42 y.o. male who [name] today for well adult visit.  Ernest BurdockRichard is doing well with no issues. He takes the lisinopril/hydrochlorothiazide listed below. He denies any chest pain, palpitations or shortness of breath.  He continues to smoke but has decreased smoking to <5 cigarettes per day.  He exercises daily.  He tries to eat a careful diet.   Past Medical History:  Diagnosis Date  . Hypertension    Past Surgical History:  Procedure Laterality Date  . APPENDECTOMY    . KNEE SURGERY     Social History   Tobacco Use  . Smoking status: Current Every Day Smoker    Packs/day: 0.50    Types: Cigarettes  . Smokeless tobacco: Never Used  Substance Use Topics  . Alcohol use: No   family history includes Cancer in his maternal grandmother; Hypertension in his father and mother.  ROS as above:  Medications: Current Outpatient Medications  Medication Sig Dispense Refill  . lisinopril-hydrochlorothiazide (PRINZIDE,ZESTORETIC) 20-25 MG tablet Take 1 tablet by mouth daily. 90 tablet 3   No current facility-administered medications for this visit.    Allergies  Allergen Reactions  . Ceftriaxone Other (See Comments)    Passed out    Health Maintenance Health Maintenance  Topic Date Due  . HIV Screening  11/14/1989  . INFLUENZA VACCINE  02/05/2018 (Originally 12/24/2016)  . TETANUS/TDAP  06/06/2027     Exam:  BP 138/89   Pulse 83   Ht 5\' 8"  (1.727 m)   Wt 167 lb (75.8 kg)   BMI 25.39 kg/m    Gen: Well NAD HEENT: EOMI,  MMM Lungs: Normal work of breathing. CTABL Heart: RRR no MRG Abd: NABS, Soft. Nondistended, Nontender Exts: Brisk capillary refill, warm and well perfused.  Skin. No dysplastic appearing nevi.     Assessment and Plan: 42 y.o. male with Well adult doing well. Continue current medications.  Check  basic labs listed below. Refill Lisinopril/HCTZ.  Tdap given.  Flu vaccine declined.    Orders Placed This Encounter  Procedures  . Tdap vaccine greater than or equal to 7yo IM  . COMPLETE METABOLIC PANEL WITH GFR  . TSH  . T4, free  . T3, free   Meds ordered this encounter  Medications  . lisinopril-hydrochlorothiazide (PRINZIDE,ZESTORETIC) 20-25 MG tablet    Sig: Take 1 tablet by mouth daily.    Dispense:  90 tablet    Refill:  3     Discussed warning signs or symptoms. Please see discharge instructions. Patient expresses understanding.

## 2017-06-06 ENCOUNTER — Other Ambulatory Visit: Payer: Self-pay | Admitting: Family Medicine

## 2017-06-06 DIAGNOSIS — R739 Hyperglycemia, unspecified: Secondary | ICD-10-CM

## 2017-08-07 DIAGNOSIS — R079 Chest pain, unspecified: Secondary | ICD-10-CM | POA: Diagnosis not present

## 2017-08-07 DIAGNOSIS — Z79899 Other long term (current) drug therapy: Secondary | ICD-10-CM | POA: Diagnosis not present

## 2017-08-07 DIAGNOSIS — I10 Essential (primary) hypertension: Secondary | ICD-10-CM | POA: Diagnosis not present

## 2017-08-07 DIAGNOSIS — M62830 Muscle spasm of back: Secondary | ICD-10-CM | POA: Diagnosis not present

## 2017-08-07 DIAGNOSIS — M62838 Other muscle spasm: Secondary | ICD-10-CM | POA: Diagnosis not present

## 2017-08-07 DIAGNOSIS — R0789 Other chest pain: Secondary | ICD-10-CM | POA: Diagnosis not present

## 2017-08-07 DIAGNOSIS — X509XXA Other and unspecified overexertion or strenuous movements or postures, initial encounter: Secondary | ICD-10-CM | POA: Diagnosis not present

## 2017-08-07 DIAGNOSIS — F17210 Nicotine dependence, cigarettes, uncomplicated: Secondary | ICD-10-CM | POA: Diagnosis not present

## 2017-08-07 DIAGNOSIS — Z881 Allergy status to other antibiotic agents status: Secondary | ICD-10-CM | POA: Diagnosis not present

## 2017-08-20 ENCOUNTER — Encounter: Payer: Self-pay | Admitting: Family Medicine

## 2017-10-08 ENCOUNTER — Encounter: Payer: Self-pay | Admitting: Physician Assistant

## 2017-10-08 ENCOUNTER — Ambulatory Visit: Payer: BLUE CROSS/BLUE SHIELD | Admitting: Family Medicine

## 2017-10-08 ENCOUNTER — Ambulatory Visit: Payer: BLUE CROSS/BLUE SHIELD | Admitting: Physician Assistant

## 2017-10-08 VITALS — BP 126/87 | HR 71 | Wt 159.0 lb

## 2017-10-08 DIAGNOSIS — Z789 Other specified health status: Secondary | ICD-10-CM

## 2017-10-08 DIAGNOSIS — F411 Generalized anxiety disorder: Secondary | ICD-10-CM

## 2017-10-08 DIAGNOSIS — F17219 Nicotine dependence, cigarettes, with unspecified nicotine-induced disorders: Secondary | ICD-10-CM | POA: Diagnosis not present

## 2017-10-08 DIAGNOSIS — F410 Panic disorder [episodic paroxysmal anxiety] without agoraphobia: Secondary | ICD-10-CM | POA: Insufficient documentation

## 2017-10-08 DIAGNOSIS — F1090 Alcohol use, unspecified, uncomplicated: Secondary | ICD-10-CM | POA: Insufficient documentation

## 2017-10-08 HISTORY — DX: Panic disorder (episodic paroxysmal anxiety): F41.0

## 2017-10-08 HISTORY — DX: Generalized anxiety disorder: F41.1

## 2017-10-08 MED ORDER — ESCITALOPRAM OXALATE 5 MG PO TABS: ORAL_TABLET | ORAL | 0 refills | Status: DC

## 2017-10-08 MED ORDER — ALPRAZOLAM 0.5 MG PO TABS: 0.5000 mg | ORAL_TABLET | Freq: Three times a day (TID) | ORAL | 0 refills | Status: DC | PRN

## 2017-10-08 NOTE — Progress Notes (Signed)
HPI:                                                                Ernest Cole is a 42 y.o. male who presents to [facility]: Primary Care Sports Medicine today for anxiety / panic attacks  This is a 42 yo M with PMH of HTN, anxiety, tobacco use, who presents with worsening anxiety for the last week. States he has had 3 panic attacks this week, including a severe one this morning at his son's doctor's office where they offered to call 911. Describes feeling nervous, scared, shaky, and cold during the event. He also describes an abnormal sensation in his tongue that seems to precede the panic attack. Relieved by resting and comfort from a friend or family member. Reports he is an anxious "high-strung" person at baseline. Denies specific phobias or triggers. Drinks 3-5 beers per day Smokes 1/2 ppd Denies illicit drug use No history of hospitalizations No prior medications   Depression screen [facility] 2/9 10/08/2017 06/05/2017 02/11/2016  Decreased Interest 0 0 0  Down, Depressed, Hopeless 0 0 0  PHQ - 2 Score 0 0 0  Altered sleeping 1 - 2  Tired, decreased energy 1 - 2  Change in appetite 0 - 0  Feeling bad or failure about yourself  0 - 0  Trouble concentrating 0 - 2  Moving slowly or fidgety/restless 2 - 2  Suicidal thoughts 0 - 0  PHQ-9 Score 4 - 8  Difficult doing work/chores - - Somewhat difficult    GAD 7 : Generalized Anxiety Score 10/08/2017 02/11/2016  Nervous, Anxious, on Edge 2 3  Control/stop worrying 1 2  Worry too much - different things 1 3  Trouble relaxing 1 3  Restless 1 3  Easily annoyed or irritable 1 2  Afraid - awful might happen 0 2  Total GAD 7 Score 7 18  Anxiety Difficulty - Somewhat difficult      Past Medical History:  Diagnosis Date  . Hypertension    Past Surgical History:  Procedure Laterality Date  . APPENDECTOMY    . KNEE SURGERY     Social History   Tobacco Use  . Smoking status: Current Every Day Smoker   Packs/day: 0.50    Types: Cigarettes  . Smokeless tobacco: Never Used  Substance Use Topics  . Alcohol use: No   family history includes Cancer in his maternal grandmother; Hypertension in his father and mother.    ROS: negative except as noted in the HPI  Medications: Current Outpatient Medications  Medication Sig Dispense Refill  . lisinopril-hydrochlorothiazide (PRINZIDE,ZESTORETIC) 20-25 MG tablet Take 1 tablet by mouth daily. 90 tablet 3   No current facility-administered medications for this visit.    Allergies  Allergen Reactions  . Ceftriaxone Other (See Comments)    Passed out       Objective:  BP 126/87   Pulse 71   Wt 159 lb (72.1 kg)   SpO2 99%   BMI 24.18 kg/m  Gen:  alert, not ill-appearing, no distress, appropriate for age HEENT: head normocephalic without obvious abnormality, conjunctiva and cornea clear, trachea midline Pulm: Normal work of breathing, normal phonation, clear to auscultation bilaterally, no wheezes, rales or rhonchi CV: Normal rate, regular  rhythm, s1 and s2 distinct, no murmurs, clicks or rubs  Neuro: alert and oriented x 3, no tremor MSK: extremities atraumatic, normal gait and station Skin: facial flushing, no rashes Psych: appearance casual, cooperative, good eye contact, appears very anxious, mild psychomotor agitation, affect full range, speech is articulate, and thought processes clear and goal-directed    No results found for this or any previous visit (from the past 72 hour(s)). No results found.    Assessment and Plan: 42 y.o. male with   Generalized anxiety disorder with panic attacks - Plan: escitalopram (LEXAPRO) 5 MG tablet  Heavy alcohol consumption  Cigarette nicotine dependence with nicotine-induced disorder  GAD7=7, PHQ2=0 Discussed treatment options to include medication and non-medication options (CBT, mindfulness, meditation/relaxation techniques) Patient would like to try Lexapro. Counseled on risks  and benefits. Counseled that he may feel worse before he feels better. Starting 2.5 mg nightly x 3 days with self-titration to 5 mg nightly and close follow-up with PCP in 1 week. Alprazolam 0.5 mg every 8 hours as needed for severe anxiety/panic Encouraged him to gradually reduce alcohol intake, which can interact with SSRI Encouraged to gradually reduce nicotine, which may worsen anxiety   Patient education and anticipatory guidance given Patient agrees with treatment plan Follow-up in 1 week with PCP or sooner as needed if symptoms worsen or fail to improve  Levonne Hubert PA-C

## 2017-10-08 NOTE — Patient Instructions (Signed)

## 2017-10-15 ENCOUNTER — Ambulatory Visit: Payer: BLUE CROSS/BLUE SHIELD | Admitting: Family Medicine

## 2017-10-15 ENCOUNTER — Encounter: Payer: Self-pay | Admitting: Family Medicine

## 2017-10-15 VITALS — BP 124/83 | HR 79 | Wt 156.0 lb

## 2017-10-15 DIAGNOSIS — F410 Panic disorder [episodic paroxysmal anxiety] without agoraphobia: Secondary | ICD-10-CM | POA: Diagnosis not present

## 2017-10-15 DIAGNOSIS — F411 Generalized anxiety disorder: Secondary | ICD-10-CM | POA: Diagnosis not present

## 2017-10-15 DIAGNOSIS — Z789 Other specified health status: Secondary | ICD-10-CM | POA: Diagnosis not present

## 2017-10-15 DIAGNOSIS — F1090 Alcohol use, unspecified, uncomplicated: Secondary | ICD-10-CM

## 2017-10-15 MED ORDER — ALPRAZOLAM 0.25 MG PO TABS: 0.2500 mg | ORAL_TABLET | Freq: Three times a day (TID) | ORAL | 0 refills | Status: DC | PRN

## 2017-10-15 NOTE — Progress Notes (Signed)
Ernest Cole is a 42 y.o. male who presents to [facility]: Primary Care Sports Medicine today for anxiety and panic disorder.  Ordell was seen by my partner Rosita Kea PA-C last week for what is described as a panic attack.  He describes onset of extreme anxiety associated with racing heart and sweating and dry mouth while driving.  This is happened several times.  He was prescribed Xanax and started on Lexapro.  He is here 1 week later and notes that the Xanax certainly does help quite a bit.  He is interested in continuing Lexapro and feeling better overall.  He denies a personal history of anxiety or depression.  He denies chest pain palpitations or severe shortness of breath.  He notes increased stressors at home and at work overall.  Additionally at the visit last week he noted that he had been drinking about 4 drinks per day.  He was advised to cut back.  He has discontinued alcohol and feels fine.   ROS as above:  Exam:  BP 124/83   Pulse 79   Wt 156 lb (70.8 kg)   BMI 23.72 kg/m  Gen: Well NAD HEENT: EOMI,  MMM Lungs: Normal work of breathing. CTABL Heart: RRR no MRG Abd: NABS, Soft. Nondistended, Nontender Exts: Brisk capillary refill, warm and well perfused.  Psych: Alert and oriented normal speech thought process and affect.  Depression screen [facility] 2/9 10/15/2017 10/08/2017 06/05/2017 02/11/2016  Decreased Interest 1 0 0 0  Down, Depressed, Hopeless 0 0 0 0  PHQ - 2 Score 1 0 0 0  Altered sleeping 1 1 - 2  Tired, decreased energy 1 1 - 2  Change in appetite 1 0 - 0  Feeling bad or failure about yourself  0 0 - 0  Trouble concentrating 1 0 - 2  Moving slowly or fidgety/restless 1 2 - 2  Suicidal thoughts 0 0 - 0  PHQ-9 Score 6 4 - 8  Difficult doing work/chores - - - Somewhat difficult   GAD 7 : Generalized Anxiety Score 10/15/2017 10/08/2017 02/11/2016  Nervous, Anxious, on  Edge Control/stop worrying Worry too much - different things Trouble relaxing Restless Easily annoyed or irritable Afraid - awful might happen 1 0 2  Total GAD 7 Score Anxiety Difficulty Somewhat difficult - Somewhat difficult         Assessment and Plan: 42 y.o. male with  Generalized anxiety disorder with panic features. Plan to continue Lexapro at 5 mg daily.  Plan to continue Xanax and try to taper off in the near future. Refer to behavioral health for counseling.  Patient will benefit from cognitive behavioral therapy. Recheck in 2 weeks.  Return sooner if needed.   Orders Placed This Encounter  Procedures  . Ambulatory referral to Behavioral Health    Referral Priority:   Routine    Referral Type:   Psychiatric    Referral Reason:   Specialty Services Required    Requested Specialty:   Behavioral Health    Number of Visits Requested:   1   Meds ordered this encounter  Medications  . ALPRAZolam (XANAX) 0.25 MG tablet    Sig: Take 1 tablet (0.25 mg total) by mouth every 8 (eight) hours as needed for anxiety (panic attack).  Dispense:  30 tablet    Refill:  0     Historical information moved to improve visibility of documentation.  Past Medical History:  Diagnosis Date  . Hypertension    Past Surgical History:  Procedure Laterality Date  . APPENDECTOMY    . KNEE SURGERY     Social History   Tobacco Use  . Smoking status: Current Every Day Smoker    Packs/day: 0.50    Types: Cigarettes  . Smokeless tobacco: Never Used  Substance Use Topics  . Alcohol use: No   family history includes Cancer in his maternal grandmother; Hypertension in his father and mother.  Medications: Current Outpatient Medications  Medication Sig Dispense Refill  . ALPRAZolam (XANAX) 0.25 MG tablet Take 1 tablet (0.25 mg total) by mouth every 8 (eight) hours as needed for anxiety (panic attack). 30 tablet 0  . escitalopram  (LEXAPRO) 5 MG tablet 1/2 tab PO QHS x 3 days, then 1 tab PO QHS 30 tablet 0  . lisinopril-hydrochlorothiazide (PRINZIDE,ZESTORETIC) 20-25 MG tablet Take 1 tablet by mouth daily. 90 tablet 3   No current facility-administered medications for this visit.    Allergies  Allergen Reactions  . Ceftriaxone Other (See Comments)    Passed out    Health Maintenance Health Maintenance  Topic Date Due  . HIV Screening  11/14/1989  . INFLUENZA VACCINE  02/05/2018 (Originally 12/24/2017)  . TETANUS/TDAP  06/06/2027    Discussed warning signs or symptoms. Please see discharge instructions. Patient expresses understanding.

## 2017-10-15 NOTE — Patient Instructions (Addendum)
Thank you for coming in today. Continue lexapro at  daily.  Use xanax 0.25mg  intermittently as needed.   Recheck with me in about weeks.   Schedule therapy.  Look for LCSW anxiety and CBT  Work on good quality sleep.  Reduce alcohol.

## 2017-10-29 ENCOUNTER — Ambulatory Visit: Payer: BLUE CROSS/BLUE SHIELD | Admitting: Family Medicine

## 2017-10-29 ENCOUNTER — Encounter: Payer: Self-pay | Admitting: Family Medicine

## 2017-10-29 DIAGNOSIS — F411 Generalized anxiety disorder: Secondary | ICD-10-CM | POA: Diagnosis not present

## 2017-10-29 DIAGNOSIS — F410 Panic disorder [episodic paroxysmal anxiety] without agoraphobia: Secondary | ICD-10-CM | POA: Diagnosis not present

## 2017-10-29 MED ORDER — ESCITALOPRAM OXALATE 10 MG PO TABS: 10.0000 mg | ORAL_TABLET | Freq: Every day | ORAL | 3 refills | Status: DC

## 2017-10-29 NOTE — Patient Instructions (Signed)
Thank you for coming in today. Continue low dose xanax as needed.  Increase lexapro to 10mg  daily.  Recheck with me in 4-6 weeks. Return sooner if needed.   Attend therapy.  Consider self guided CBT.

## 2017-10-29 NOTE — Progress Notes (Signed)
Ernest Cole is a 42 y.o. male who [name] today for follow-up anxiety.  Ernest Cole was seen on May 23 for generalized anxiety disorder and panic attacks.  At that point he was started on a low-dose of Xanax and Lexapro.  Additionally he was referred to counseling for cognitive behavioral therapy.  He notes that the Xanax has been extremely helpful.  He typically takes 1 or a half of a pill in the morning and notes significant decrease in his anxiety level.  He is able to drive and work and be functional.  He is not sure if the Lexapro is working yet but is still taking it.  He notes that he is much better but still somewhat symptomatic.  He has scheduled his cognitive behavioral therapy but unfortunately that will not start until July 16.  He notes that he has remained abstinent of alcohol .   ROS as above:  Exam:  BP 130/84   Pulse 76   Wt 155 lb (70.3 kg)   BMI 23.57 kg/m  Gen: Well NAD HEENT: EOMI,  MMM Lungs: Normal work of breathing. CTABL Heart: RRR no MRG Abd: NABS, Soft. Nondistended, Nontender Exts: Brisk capillary refill, warm and well perfused.  Psych alert and oriented normal speech thought process and affect.  [facility] 10/29/2017 10/15/2017 10/08/2017 06/05/2017 02/11/2016  Decreased Interest 0 1 0 0 0  Down, Depressed, Hopeless 0 0 0 0 0  PHQ - 2 Score 0 1 0 0 0  Altered sleeping 1 1 1  - 2  Tired, decreased energy 1 1 1  - 2  Change in appetite 0 1 0 - 0  Feeling bad or failure about yourself  0 0 0 - 0  Trouble concentrating 0 1 0 - 2  Moving slowly or fidgety/restless 1 1 2  - 2  Suicidal thoughts 0 0 0 - 0  PHQ-9 Score 3 6 4  - 8  Difficult doing work/chores Somewhat difficult - - - Somewhat difficult   GAD 7 : Generalized Anxiety Score 10/29/2017 10/15/2017 10/08/2017 02/11/2016  Nervous, Anxious, on Edge 1 1 2 3     Control/stop worrying 1 1 1 2   Worry too much - different things 1 1 1 3   Trouble relaxing 1 1 1 3   Restless 1 1 1 3   Easily annoyed or irritable 1 1 1 2   Afraid - awful might happen 1 1 0 2  Total GAD 7 Score 7 7 7 18   Anxiety Difficulty Somewhat difficult Somewhat difficult - Somewhat difficult       Assessment and Plan: 42 y.o. male with  Generalized anxiety disorder with panic attacks.  Significant early improvement with Xanax.  Plan to increase Lexapro from 5 mg daily to 10 mg daily.  Continue current Xanax dose and attempt to wean in the near future.  Follow-up with cognitive behavioral therapy and recheck with me in about a month to 6 weeks.  Return sooner if needed.  I spent 15 minutes with this patient, greater than 50% was face-to-face time counseling regarding treatment plan and back up plan.   No orders of the defined types were placed in this encounter.  No orders of the defined types were placed in this encounter.    Historical information moved to improve visibility of documentation.  Past Medical History:  Diagnosis Date  . Hypertension    Past Surgical History:  Procedure Laterality Date  .  APPENDECTOMY    . KNEE SURGERY     Social History   Tobacco Use  . Smoking status: Current Every Day Smoker    Packs/day: 0.50    Types: Cigarettes  . Smokeless tobacco: Never Used  Substance Use Topics  . Alcohol use: No   family history includes Cancer in his maternal grandmother; Hypertension in his father and mother.  Medications: Current Outpatient Medications  Medication Sig Dispense Refill  . ALPRAZolam (XANAX) 0.25 MG tablet Take 1 tablet (0.25 mg total) by mouth every 8 (eight) hours as needed for anxiety (panic attack). 30 tablet 0  . escitalopram (LEXAPRO) 5 MG tablet 1/2 tab PO QHS x 3 days, then 1 tab PO QHS 30 tablet 0  . lisinopril-hydrochlorothiazide (PRINZIDE,ZESTORETIC) 20-25 MG tablet Take 1 tablet by mouth daily. 90 tablet 3   No current  facility-administered medications for this visit.    Allergies  Allergen Reactions  . Ceftriaxone Other (See Comments)    Passed out    Health Maintenance Health Maintenance  Topic Date Due  . HIV Screening  11/14/1989  . INFLUENZA VACCINE  02/05/2018 (Originally 12/24/2017)  . TETANUS/TDAP  06/06/2027    Discussed warning signs or symptoms. Please see discharge instructions. Patient expresses understanding.

## 2017-11-09 ENCOUNTER — Other Ambulatory Visit: Payer: Self-pay | Admitting: Physician Assistant

## 2017-12-08 ENCOUNTER — Ambulatory Visit (HOSPITAL_COMMUNITY): Payer: BLUE CROSS/BLUE SHIELD | Admitting: Psychiatry

## 2017-12-18 ENCOUNTER — Ambulatory Visit: Payer: BLUE CROSS/BLUE SHIELD | Admitting: Family Medicine

## 2017-12-18 ENCOUNTER — Encounter: Payer: Self-pay | Admitting: Family Medicine

## 2017-12-18 VITALS — BP 133/71 | HR 80 | Temp 98.7°F | Wt 159.3 lb

## 2017-12-18 DIAGNOSIS — L739 Follicular disorder, unspecified: Secondary | ICD-10-CM

## 2017-12-18 DIAGNOSIS — F410 Panic disorder [episodic paroxysmal anxiety] without agoraphobia: Secondary | ICD-10-CM

## 2017-12-18 DIAGNOSIS — I10 Essential (primary) hypertension: Secondary | ICD-10-CM

## 2017-12-18 DIAGNOSIS — F411 Generalized anxiety disorder: Secondary | ICD-10-CM | POA: Diagnosis not present

## 2017-12-18 MED ORDER — ESCITALOPRAM OXALATE 10 MG PO TABS: 10.0000 mg | ORAL_TABLET | Freq: Every day | ORAL | 3 refills | Status: DC

## 2017-12-18 MED ORDER — MUPIROCIN 2 % EX OINT: TOPICAL_OINTMENT | Freq: Two times a day (BID) | CUTANEOUS | 3 refills | Status: AC

## 2017-12-18 MED ORDER — DOXYCYCLINE HYCLATE 100 MG PO TABS: 100.0000 mg | ORAL_TABLET | Freq: Two times a day (BID) | ORAL | 0 refills | Status: DC

## 2017-12-18 MED ORDER — ALPRAZOLAM 0.25 MG PO TABS: ORAL_TABLET | ORAL | 1 refills | Status: DC

## 2017-12-18 NOTE — Progress Notes (Signed)
Ernest Cole is a 42 y.o. male who presents to [facility]: Primary Care [facility] Medicine today for follow-up anxiety and depression.  Ernest Cole has been seen several times over the last month for anxiety and panic disorder.  He currently takes Lexapro 10 mg daily and a half of a 0.25 mg Xanax in the morning.  He notes his current regimen has worked extremely well to control his symptoms.  He is asymptomatic and feeling great.  He happy that he is feeling much better and able to focus on his family and his work.  He notes also about a one-week history of right-sided scalp skin irritation and redness.  He notes it is mildly painful or mildly itchy.  He denies fevers or chills nausea vomiting or diarrhea.  He notes that he routinely wears a baseball cap and thinks that the band of the cap may be rubbing his scalp.  ROS as above:  Exam:  BP 133/71   Pulse 80   Temp 98.7 F (37.1 C) (Oral)   Wt 159 lb 4.8 oz (72.3 kg)   BMI 24.22 kg/m  Gen: Well NAD HEENT: EOMI,  MMM  Lungs: Normal work of breathing. CTABL Heart: RRR no MRG Abd: NABS, Soft. Nondistended, Nontender Exts: Brisk capillary refill, warm and well perfused.  Scalp: Parietal scalp with areas of erythema with small pustules consistent appearance of folliculitis.  No other skin or scalp changes. Skin: Alert and oriented normal speech thought process and affect.  No SI or HI expressed.  Depression screen [facility] 2/9 12/18/2017 10/29/2017 10/15/2017 10/08/2017 06/05/2017  Decreased Interest 0 0 1 0 0  Down, Depressed, Hopeless 0 0 0 0 0  PHQ - 2 Score 0 0 1 0 0  Altered sleeping 0 1 1 1  -  Tired, decreased energy 0 1 1 1  -  Change in appetite 0 0 1 0 -  Feeling bad or failure about yourself  0 0 0 0 -  Trouble concentrating 0 0 1 0 -  Moving slowly or fidgety/restless 0 1 1 2  -  Suicidal thoughts 0 0 0 0 -  PHQ-9 Score 0 3 6 4  -  Difficult doing  work/chores Not difficult at all Somewhat difficult - - -   GAD 7 : Generalized Anxiety Score 12/18/2017 10/29/2017 10/15/2017 10/08/2017  Nervous, Anxious, on Edge 0 1 1 2   Control/stop worrying 0 1 1 1   Worry too much - different things 0 1 1 1   Trouble relaxing 0 1 1 1   Restless 0 1 1 1   Easily annoyed or irritable 0 1 1 1   Afraid - awful might happen 0 1 1 0  Total GAD 7 Score 0 7 7 7   Anxiety Difficulty Not difficult at all Somewhat difficult Somewhat difficult -       Assessment and Plan: 42 y.o. male with  Anxiety.  Doing well.  Continue current regimen.  If all is well recheck yearly.  Folliculitis: Most likely diagnosis for scalp issue.  Shingles is a possibility but much less likely.  The appearance is not entirely consistent with shingles.  Plan for treatment with doxycycline antibiotics orally along with topical mupirocin antibody ointment and chlorhexidine skin wash.  If not improving recheck.  Additionally recommend avoiding tight hatbands for a few weeks until the scalp issue resolves.  New issue today.  Uncertain prognosis.  Hypertension: Blood pressure controlled.  Continue current regimen.  No orders of the defined types  were placed in this encounter.  Meds ordered this encounter  Medications  . ALPRAZolam (XANAX) 0.25 MG tablet    Sig: 1/2 tablet daily    Dispense:  45 tablet    Refill:  1  . escitalopram (LEXAPRO) 10 MG tablet    Sig: Take 1 tablet (10 mg total) by mouth at bedtime.    Dispense:  90 tablet    Refill:  3    When due for refill  . doxycycline (VIBRA-TABS) 100 MG tablet    Sig: Take 1 tablet (100 mg total) by mouth 2 (two) times daily.    Dispense:  14 tablet    Refill:  0  . mupirocin ointment (BACTROBAN) 2 %    Sig: Apply topically 2 (two) times daily.    Dispense:  30 g    Refill:  3     Historical information moved to improve visibility of documentation.  Past Medical History:  Diagnosis Date  . Generalized anxiety disorder with  panic attacks 10/08/2017  . Hypertension   . Rosacea 07/03/2016   Past Surgical History:  Procedure Laterality Date  . APPENDECTOMY    . KNEE SURGERY     Social History   Tobacco Use  . Smoking status: Current Every Day Smoker    Packs/day: 0.50    Types: Cigarettes  . Smokeless tobacco: Never Used  Substance Use Topics  . Alcohol use: No   family history includes Cancer in his maternal grandmother; Hypertension in his father and mother.  Medications: Current Outpatient Medications  Medication Sig Dispense Refill  . ALPRAZolam (XANAX) 0.25 MG tablet 1/2 tablet daily 45 tablet 1  . escitalopram (LEXAPRO) 10 MG tablet Take 1 tablet (10 mg total) by mouth at bedtime. 90 tablet 3  . lisinopril-hydrochlorothiazide (PRINZIDE,ZESTORETIC) 20-25 MG tablet Take 1 tablet by mouth daily. 90 tablet 3  . doxycycline (VIBRA-TABS) 100 MG tablet Take 1 tablet (100 mg total) by mouth 2 (two) times daily. 14 tablet 0  . mupirocin ointment (BACTROBAN) 2 % Apply topically 2 (two) times daily. 30 g 3   No current facility-administered medications for this visit.    Allergies  Allergen Reactions  . Ceftriaxone Other (See Comments)    Passed out     Discussed warning signs or symptoms. Please see discharge instructions. Patient expresses understanding.

## 2017-12-18 NOTE — Patient Instructions (Signed)
Thank you for coming in today. Take doxycycline twice daily for 1 week.  Use the antibiotic ointment.  Use topical over the counter chlorhexadine body wash in the shower daily and for a few days after the antibiotics finish.  Recheck as needed.  Reduce hat.   Continue current medicine for anxiety .   Recheck as needed.    Folliculitis Folliculitis is inflammation of the hair follicles. Folliculitis most commonly occurs on the scalp, thighs, legs, back, and buttocks. However, it can occur anywhere on the body. What are the causes? This condition may be caused by:  A bacterial infection (common).  A fungal infection.  A viral infection.  Coming into contact with certain chemicals, especially oils and tars.  Shaving or waxing.  Applying greasy ointments or creams to your skin often.  Long-lasting folliculitis and folliculitis that keeps coming back can be caused by bacteria that live in the nostrils. What increases the risk? This condition is more likely to develop in people with:  A weakened immune system.  Diabetes.  Obesity.  What are the signs or symptoms? Symptoms of this condition include:  Redness.  Soreness.  Swelling.  Itching.  Small white or yellow, pus-filled, itchy spots (pustules) that appear over a reddened area. If there is an infection that goes deep into the follicle, these may develop into a boil (furuncle).  A group of closely packed boils (carbuncle). These tend to form in hairy, sweaty areas of the body.  How is this diagnosed? This condition is diagnosed with a skin exam. To find what is causing the condition, your health care provider may take a sample of one of the pustules or boils for testing. How is this treated? This condition may be treated by:  Applying warm compresses to the affected areas.  Taking an antibiotic medicine or applying an antibiotic medicine to the skin.  Applying or bathing with an antiseptic  solution.  Taking an over-the-counter medicine to help with itching.  Having a procedure to drain any pustules or boils. This may be done if a pustule or boil contains a lot of pus or fluid.  Laser hair removal. This may be done to treat long-lasting folliculitis.  Follow these instructions at home:  If directed, apply heat to the affected area as often as told by your health care provider. Use the heat source that your health care provider recommends, such as a moist heat pack or a heating pad. ? Place a towel between your skin and the heat source. ? Leave the heat on for 20-30 minutes. ? Remove the heat if your skin turns bright red. This is especially important if you are unable to feel pain, heat, or cold. You may have a greater risk of getting burned.  If you were prescribed an antibiotic medicine, use it as told by your health care provider. Do not stop using the antibiotic even if you start to feel better.  Take over-the-counter and prescription medicines only as told by your health care provider.  Do not shave irritated skin.  Keep all follow-up visits as told by your health care provider. This is important. Get help right away if:  You have more redness, swelling, or pain in the affected area.  Red streaks are spreading from the affected area.  You have a fever. This information is not intended to replace advice given to you by your health care provider. Make sure you discuss any questions you have with your health care provider. Document Released:  07/21/2001 Document Revised: 11/30/2015 Document Reviewed: 03/02/2015 Elsevier Interactive Patient Education  [employer].

## 2018-02-17 ENCOUNTER — Telehealth: Payer: Self-pay | Admitting: Family Medicine

## 2018-02-17 NOTE — Telephone Encounter (Signed)
PT called stating having the same skin issue on 12/18/17 appointment.   He was curious if he can have that sent in again or do you need a new evaluation.  Please advise.

## 2018-02-18 MED ORDER — DOXYCYCLINE HYCLATE 100 MG PO TABS: 100.0000 mg | ORAL_TABLET | Freq: Two times a day (BID) | ORAL | 0 refills | Status: AC

## 2018-02-18 NOTE — Telephone Encounter (Signed)
Please see phone note. Ernest Cole,CMA

## 2018-02-18 NOTE — Telephone Encounter (Signed)
Doxycycline sent to pharmacy.  If not improving return for recheck

## 2018-02-18 NOTE — Telephone Encounter (Signed)
Called patient and LM on VM of below message. KG LPN

## 2018-02-23 ENCOUNTER — Ambulatory Visit: Payer: BLUE CROSS/BLUE SHIELD | Admitting: Family Medicine

## 2018-06-24 ENCOUNTER — Other Ambulatory Visit: Payer: Self-pay | Admitting: Family Medicine

## 2018-06-24 DIAGNOSIS — F411 Generalized anxiety disorder: Principal | ICD-10-CM

## 2018-06-24 DIAGNOSIS — F410 Panic disorder [episodic paroxysmal anxiety] without agoraphobia: Secondary | ICD-10-CM

## 2018-06-24 NOTE — Telephone Encounter (Signed)
Last RF 12/18/17 for #45 with 1 RF   Last OV 12/18/17  RX pended, please review and send if appropriate

## 2018-06-28 ENCOUNTER — Other Ambulatory Visit: Payer: Self-pay | Admitting: Family Medicine

## 2018-06-28 DIAGNOSIS — I10 Essential (primary) hypertension: Secondary | ICD-10-CM

## 2018-11-12 ENCOUNTER — Other Ambulatory Visit: Payer: Self-pay | Admitting: Family Medicine

## 2018-11-12 DIAGNOSIS — F411 Generalized anxiety disorder: Secondary | ICD-10-CM

## 2018-11-12 DIAGNOSIS — F410 Panic disorder [episodic paroxysmal anxiety] without agoraphobia: Secondary | ICD-10-CM

## 2018-11-19 ENCOUNTER — Other Ambulatory Visit: Payer: Self-pay | Admitting: Family Medicine

## 2018-11-19 DIAGNOSIS — F410 Panic disorder [episodic paroxysmal anxiety] without agoraphobia: Secondary | ICD-10-CM

## 2018-11-22 NOTE — Telephone Encounter (Signed)
Will forward request to provider.

## 2019-02-10 ENCOUNTER — Other Ambulatory Visit: Payer: Self-pay | Admitting: Family Medicine

## 2019-02-10 DIAGNOSIS — F410 Panic disorder [episodic paroxysmal anxiety] without agoraphobia: Secondary | ICD-10-CM

## 2019-03-03 ENCOUNTER — Other Ambulatory Visit: Payer: Self-pay | Admitting: Family Medicine

## 2019-03-03 DIAGNOSIS — F410 Panic disorder [episodic paroxysmal anxiety] without agoraphobia: Secondary | ICD-10-CM

## 2019-06-14 ENCOUNTER — Other Ambulatory Visit: Payer: Self-pay | Admitting: Family Medicine

## 2019-06-14 DIAGNOSIS — I10 Essential (primary) hypertension: Secondary | ICD-10-CM

## 2019-06-16 NOTE — Telephone Encounter (Signed)
Must make appointment for additional refills

## 2019-08-26 ENCOUNTER — Other Ambulatory Visit: Payer: Self-pay | Admitting: Family Medicine

## 2019-08-26 DIAGNOSIS — F410 Panic disorder [episodic paroxysmal anxiety] without agoraphobia: Secondary | ICD-10-CM

## 2019-08-26 DIAGNOSIS — F411 Generalized anxiety disorder: Secondary | ICD-10-CM

## 2019-09-11 ENCOUNTER — Other Ambulatory Visit: Payer: Self-pay | Admitting: Family Medicine

## 2019-09-11 DIAGNOSIS — I10 Essential (primary) hypertension: Secondary | ICD-10-CM

## 2019-10-06 ENCOUNTER — Other Ambulatory Visit: Payer: Self-pay | Admitting: Sports Medicine

## 2019-10-06 DIAGNOSIS — I10 Essential (primary) hypertension: Secondary | ICD-10-CM
